# Patient Record
Sex: Male | Born: 1990 | Race: White | Hispanic: No | Marital: Single | Smoking: Current some day smoker
Health system: Southern US, Community
[De-identification: ages and names within clinical notes are randomized; demographics above are authoritative.]

## PROBLEM LIST (undated history)

## (undated) DIAGNOSIS — K219 Gastro-esophageal reflux disease without esophagitis: Secondary | ICD-10-CM

---
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
## 2011-12-17 DIAGNOSIS — S058X9A Other injuries of unspecified eye and orbit, initial encounter: Secondary | ICD-10-CM | POA: Insufficient documentation

## 2011-12-17 DIAGNOSIS — Y9367 Activity, basketball: Secondary | ICD-10-CM | POA: Insufficient documentation

## 2011-12-17 DIAGNOSIS — F17200 Nicotine dependence, unspecified, uncomplicated: Secondary | ICD-10-CM | POA: Insufficient documentation

## 2011-12-17 DIAGNOSIS — Y9239 Other specified sports and athletic area as the place of occurrence of the external cause: Secondary | ICD-10-CM | POA: Insufficient documentation

## 2011-12-17 DIAGNOSIS — W219XXA Striking against or struck by unspecified sports equipment, initial encounter: Secondary | ICD-10-CM | POA: Insufficient documentation

## 2011-12-17 DIAGNOSIS — Y92838 Other recreation area as the place of occurrence of the external cause: Secondary | ICD-10-CM | POA: Insufficient documentation

## 2011-12-18 ENCOUNTER — Encounter (HOSPITAL_COMMUNITY): Payer: Self-pay

## 2011-12-18 ENCOUNTER — Emergency Department (HOSPITAL_COMMUNITY)
Admission: EM | Admit: 2011-12-18 | Discharge: 2011-12-18 | Disposition: A | Discharge status: Home / Self Care | Payer: BC Managed Care – PPO | Attending: Emergency Medicine | Admitting: Emergency Medicine

## 2011-12-18 DIAGNOSIS — S0500XA Injury of conjunctiva and corneal abrasion without foreign body, unspecified eye, initial encounter: Secondary | ICD-10-CM

## 2011-12-18 DIAGNOSIS — H1130 Conjunctival hemorrhage, unspecified eye: Secondary | ICD-10-CM

## 2011-12-18 MED ORDER — FLUORESCEIN SODIUM 1 MG OP STRP
1.0000 | ORAL_STRIP | Freq: Once | OPHTHALMIC | Status: AC
  Administered 2011-12-18: 1 via OPHTHALMIC

## 2011-12-18 MED ORDER — TETRACAINE HCL 0.5 % OP SOLN
OPHTHALMIC | Status: AC
  Administered 2011-12-18: 01:00:00 via NASAL
  Filled 2011-12-18: qty 2

## 2011-12-18 MED ORDER — IBUPROFEN 800 MG PO TABS
800.0000 mg | ORAL_TABLET | Freq: Once | ORAL | Status: AC
  Administered 2011-12-18: 800 mg via ORAL

## 2011-12-18 MED ORDER — IBUPROFEN 800 MG PO TABS: 800.0000 mg | ORAL_TABLET | Freq: Three times a day (TID) | ORAL | Status: DC

## 2011-12-18 MED ORDER — ERYTHROMYCIN 5 MG/GM OP OINT
TOPICAL_OINTMENT | Freq: Four times a day (QID) | OPHTHALMIC | Status: DC
  Administered 2011-12-18: 01:00:00 via OPHTHALMIC
  Filled 2011-12-18: qty 3.5

## 2011-12-18 MED ORDER — IBUPROFEN 800 MG PO TABS
800.0000 mg | ORAL_TABLET | Freq: Once | ORAL | Status: DC
  Filled 2011-12-18: qty 1

## 2011-12-18 MED ORDER — ERYTHROMYCIN 5 MG/GM OP OINT: TOPICAL_OINTMENT | OPHTHALMIC | Status: DC

## 2011-12-18 NOTE — ED Notes (Signed)
Was playing basketball and we all went up for the ball and I was poked in my right eye. Having pain, blurred vision, and redness to left eye.

## 2011-12-18 NOTE — ED Provider Notes (Signed)
History     CSN: [id number]  Arrival date & time 12/17/11  2354   First MD Initiated Contact with Patient 12/18/11 0009      Chief Complaint  Patient presents with  . Eye Injury    (Consider location/radiation/quality/duration/timing/severity/associated sxs/prior treatment) HPI History provided by patient.plain basketball tonight and another player poked his right eye with his finger. He is able to continue playing basketball now presents with pain and some blurry vision. No bleeding or laceration. He does complain of eye redness. He does not wear contacts or have any previous eye problems. No LOC or neck pain. Some foreign body sensation. Quality described as gritty sensation to the eye. No radiation. No periorbital pain trauma or swelling. Moderate in severity. Pain constant since onset a few hours prior to arrival. No known aggravating or alleviating factors. History reviewed. No pertinent past medical history.  History reviewed. No pertinent past surgical history.  No family history on file.  History  Substance Use Topics  . Smoking status: Current Every Day Smoker    Types: Cigarettes  . Smokeless tobacco: Not on file  . Alcohol Use: No      Review of Systems  Constitutional: Negative for fever and chills.  HENT: Negative for facial swelling.   Eyes: Negative for pain.  Respiratory: Negative for shortness of breath.   Cardiovascular: Negative for chest pain.  Gastrointestinal: Negative for vomiting.  Genitourinary: Negative for flank pain.  Musculoskeletal: Negative for back pain.  Skin: Negative for rash.  Neurological: Negative for headaches.  All other systems reviewed and are negative.    Allergies  Review of patient's allergies indicates no known allergies.  Home Medications  No current outpatient prescriptions on file.  BP 119/70  Pulse 54  Temp 98.1 F (36.7 C) (Oral)  Resp 16  Ht 6' (1.829 m)  Wt 172 lb (78.019 kg)  BMI 23.33 kg/m2  SpO2  100%  Physical Exam  Constitutional: He is oriented to person, place, and time. He appears well-developed and well-nourished.  HENT:  Head: Normocephalic and atraumatic.  Mouth/Throat: Oropharynx is clear and moist.       Left eye appears normal. Right eye with subconjunctival hemorrhage. No hyphema. No periorbital swelling, tenderness or edema. Lids and lashes flipped and clear. No foreign body visualized.  Eyes: EOM are normal. Pupils are equal, round, and reactive to light.  Neck: Neck supple.  Cardiovascular: Regular rhythm and intact distal pulses.   Pulmonary/Chest: Effort normal. No respiratory distress.  Musculoskeletal: Normal range of motion. He exhibits no edema.  Neurological: He is alert and oriented to person, place, and time.  Skin: Skin is warm and dry.    ED Course  Procedures (including critical care time)  Procedure: Occular Exam 12:42 AM Consent obtained and timeout performed. Tetracaine and fluorescein applied to right eye. Woods lamp exam- no foreign body visualized. There is dye uptake to the cornea 6:00 to 9:00 position.  eye flushed with normal saline.Pain improved with tetracaine and patient tolerated procedure well.  Erythromycin ointment applied. Motrin provided.  Ophthalmology referral provided and plan recheck 48 hours  MDM   Eye injury with corneal abrasion and subconjunctival hemorrhage. Woods lamp exam. Visual acuity testing.signs and nursing notes reviewed and considered. medications provided.        [name], MD 12/18/11 [phone]

## 2016-10-01 ENCOUNTER — Encounter (HOSPITAL_COMMUNITY): Payer: Self-pay | Admitting: Emergency Medicine

## 2016-10-01 ENCOUNTER — Emergency Department (HOSPITAL_COMMUNITY)
Admission: EM | Admit: 2016-10-01 | Discharge: 2016-10-02 | Disposition: A | Discharge status: Home / Self Care | Payer: BLUE CROSS/BLUE SHIELD | Attending: Emergency Medicine | Admitting: Emergency Medicine

## 2016-10-01 DIAGNOSIS — R1110 Vomiting, unspecified: Secondary | ICD-10-CM | POA: Insufficient documentation

## 2016-10-01 DIAGNOSIS — R590 Localized enlarged lymph nodes: Secondary | ICD-10-CM | POA: Diagnosis not present

## 2016-10-01 DIAGNOSIS — M791 Myalgia: Secondary | ICD-10-CM | POA: Diagnosis not present

## 2016-10-01 DIAGNOSIS — J029 Acute pharyngitis, unspecified: Secondary | ICD-10-CM | POA: Diagnosis present

## 2016-10-01 DIAGNOSIS — J039 Acute tonsillitis, unspecified: Secondary | ICD-10-CM | POA: Insufficient documentation

## 2016-10-01 DIAGNOSIS — R531 Weakness: Secondary | ICD-10-CM | POA: Diagnosis not present

## 2016-10-01 DIAGNOSIS — F17210 Nicotine dependence, cigarettes, uncomplicated: Secondary | ICD-10-CM | POA: Diagnosis not present

## 2016-10-01 DIAGNOSIS — M2550 Pain in unspecified joint: Secondary | ICD-10-CM | POA: Insufficient documentation

## 2016-10-01 DIAGNOSIS — Z79899 Other long term (current) drug therapy: Secondary | ICD-10-CM | POA: Diagnosis not present

## 2016-10-01 DIAGNOSIS — R0989 Other specified symptoms and signs involving the circulatory and respiratory systems: Secondary | ICD-10-CM | POA: Insufficient documentation

## 2016-10-01 LAB — COMPREHENSIVE METABOLIC PANEL
ALBUMIN: 4.1 g/dL (ref 3.5–5.0)
ALT: 19 U/L (ref 17–63)
ANION GAP: 7 (ref 5–15)
AST: 28 U/L (ref 15–41)
Alkaline Phosphatase: 57 U/L (ref 38–126)
BILIRUBIN TOTAL: 1 mg/dL (ref 0.3–1.2)
BUN: 21 mg/dL — AB (ref 6–20)
CO2: 24 mmol/L (ref 22–32)
Calcium: 8.8 mg/dL — ABNORMAL LOW (ref 8.9–10.3)
Chloride: 104 mmol/L (ref 101–111)
Creatinine, Ser: 1.33 mg/dL — ABNORMAL HIGH (ref 0.61–1.24)
GFR calc Af Amer: >60 mL/min (ref >60)
GFR calc non Af Amer: >60 mL/min (ref >60)
GLUCOSE: 104 mg/dL — AB (ref 65–99)
Potassium: 3.9 mmol/L (ref 3.5–5.1)
SODIUM: 135 mmol/L (ref 135–145)
Total Protein: 6.8 g/dL (ref 6.5–8.1)

## 2016-10-01 LAB — RAPID STREP SCREEN (MED CTR MEBANE ONLY): Streptococcus, Group A Screen (Direct): NEGATIVE

## 2016-10-01 LAB — MONONUCLEOSIS SCREEN: Mono Screen: NEGATIVE

## 2016-10-01 MED ORDER — SODIUM CHLORIDE 0.9 % IV BOLUS (SEPSIS)
1000.0000 mL | Freq: Once | INTRAVENOUS | Status: AC
  Administered 2016-10-01: 1000 mL via INTRAVENOUS

## 2016-10-01 NOTE — ED Triage Notes (Signed)
Pt c/o headache, sore throat, fever, chills, vomiting, and weakness x 3 days. Pt was [facility] for questionable peritonsillar abscess.

## 2016-10-02 ENCOUNTER — Emergency Department (HOSPITAL_COMMUNITY): Payer: BLUE CROSS/BLUE SHIELD

## 2016-10-02 LAB — CBC WITH DIFFERENTIAL/PLATELET
BASOS ABS: 0 10*3/uL (ref 0.0–0.1)
Basophils Relative: 0 %
EOS ABS: 0 10*3/uL (ref 0.0–0.7)
Eosinophils Relative: 0 %
HEMATOCRIT: 40.6 % (ref 39.0–52.0)
Hemoglobin: 14.4 g/dL (ref 13.0–17.0)
LYMPHS ABS: 0.9 10*3/uL (ref 0.7–4.0)
LYMPHS PCT: 3 %
MCH: 34.1 pg — ABNORMAL HIGH (ref 26.0–34.0)
MCHC: 35.5 g/dL (ref 30.0–36.0)
MCV: 96.2 fL (ref 78.0–100.0)
MONO ABS: 2.7 10*3/uL — AB (ref 0.1–1.0)
Monocytes Relative: 10 %
NEUTROS ABS: 24.9 10*3/uL — AB (ref 1.7–7.7)
NEUTROS PCT: 87 %
PLATELETS: 239 10*3/uL (ref 150–400)
RBC: 4.22 MIL/uL (ref 4.22–5.81)
RDW: 12.6 % (ref 11.5–15.5)
WBC: 28.6 10*3/uL — AB (ref 4.0–10.5)

## 2016-10-02 MED ORDER — IOPAMIDOL (ISOVUE-300) INJECTION 61%
75.0000 mL | Freq: Once | INTRAVENOUS | Status: AC | PRN
  Administered 2016-10-02: 75 mL via INTRAVENOUS

## 2016-10-02 MED ORDER — HYDROCODONE-ACETAMINOPHEN 5-325 MG PO TABS: 1.0000 | ORAL_TABLET | ORAL | 0 refills | Status: DC | PRN

## 2016-10-02 MED ORDER — HYDROCODONE-ACETAMINOPHEN 5-325 MG PO TABS
1.0000 | ORAL_TABLET | Freq: Once | ORAL | Status: AC
  Administered 2016-10-02: 1 via ORAL

## 2016-10-02 MED ORDER — CLINDAMYCIN PHOSPHATE 600 MG/50ML IV SOLN
600.0000 mg | Freq: Once | INTRAVENOUS | Status: AC
  Administered 2016-10-02: 600 mg via INTRAVENOUS
  Filled 2016-10-02: qty 50

## 2016-10-02 MED ORDER — SODIUM CHLORIDE 0.9 % IV BOLUS (SEPSIS): 1000.0000 mL | Freq: Once | INTRAVENOUS | Status: DC

## 2016-10-02 MED ORDER — CLINDAMYCIN HCL 300 MG PO CAPS: 300.0000 mg | ORAL_CAPSULE | Freq: Three times a day (TID) | ORAL | 0 refills | Status: DC

## 2016-10-02 MED ORDER — HYDROCODONE-ACETAMINOPHEN 5-325 MG PO TABS
ORAL_TABLET | ORAL | Status: AC
  Filled 2016-10-02: qty 1

## 2016-10-02 MED ORDER — DEXAMETHASONE SODIUM PHOSPHATE 4 MG/ML IJ SOLN
10.0000 mg | Freq: Once | INTRAMUSCULAR | Status: AC
  Administered 2016-10-02: 10 mg via INTRAVENOUS
  Filled 2016-10-02: qty 3

## 2016-10-02 NOTE — Discharge Instructions (Signed)
Take the antibiotics as prescribed. Follow up with Dr. Pollyann Kennedyosen.  There is no abscess now, but there might be one in the future. Return to the ED if you develop difficulty breathing, difficulty swallowing, or any other concerns.

## 2016-10-02 NOTE — ED Notes (Signed)
Pt tolerated po challenge well.  

## 2016-10-02 NOTE — ED Provider Notes (Signed)
[facility]-EMER[facility]ENCY DEPT Provider Note   CSN: [id number] Arrival date & time: 10/01/16  2155     History   Chief Complaint Chief Complaint  Patient presents with  . Sore Throat    HPI Jonathan Nash is a 26 y.o. male.  Patient sent from urgent care with concern for peritonsillar abscess. States he's had a sore throat with fever since last night. He has had dry heaves and one episode of vomiting today with generalized fatigue and weakness. Complains of pain to the left side of his throat and pain with swallowing. No cough. No runny nose. No diarrhea. No chest pain or shortness of breath. Denies any sick contacts.   The history is provided by the patient.  Sore Throat  Pertinent negatives include no chest pain, no abdominal pain, no headaches and no shortness of breath.    History reviewed. No pertinent past medical history.  There are no active problems to display for this patient.   History reviewed. No pertinent surgical history.     Home Medications    Prior to Admission medications   Medication Sig Start Date End Date Taking? Authorizing Provider  erythromycin ophthalmic ointment Place a 1/2 inch ribbon of ointment into the lower eyelid. 12/18/11   [name], MD  ibuprofen (ADVIL,MOTRIN) 800 MG tablet Take 1 tablet (800 mg total) by mouth 3 (three) times daily. 12/18/11   [name], MD    Family History No family history on file.  Social History Social History  Substance Use Topics  . Smoking status: Current Every Day Smoker    Types: Cigarettes  . Smokeless tobacco: Never Used  . Alcohol use No     Allergies   Patient has no known allergies.   Review of Systems Review of Systems  Constitutional: Positive for activity change, appetite change and fever.  HENT: Positive for congestion, sore throat and trouble swallowing. Negative for nosebleeds, postnasal drip and voice change.   Eyes: Negative for visual disturbance.  Respiratory: Negative for  cough and shortness of breath.   Cardiovascular: Negative for chest pain.  Gastrointestinal: Negative for abdominal pain, nausea and vomiting.  Genitourinary: Negative for dysuria and hematuria.  Musculoskeletal: Positive for arthralgias and myalgias.  Skin: Negative for rash and wound.  Neurological: Positive for weakness. Negative for dizziness and headaches.   all other systems are negative except as noted in the HPI and PMH.     Physical Exam Updated Vital Signs BP 110/65   Pulse 73   Temp 100.2 F (37.9 C) (Oral)   Resp 16   Ht 6' (1.829 m)   Wt 75.8 kg (167 lb)   SpO2 98%   BMI 22.65 kg/m   Physical Exam  Constitutional: He is oriented to person, place, and time. He appears well-developed and well-nourished. No distress.  Ill appearing, nontoxic  HENT:  Head: Normocephalic and atraumatic.  Mouth/Throat: Oropharyngeal exudate present.  Erythematous tonsils left greater than right with possible soft palate bulge in the left. Uvula is midline. Exudate present. No stridor. No trismus. Normal voice  Eyes: Pupils are equal, round, and reactive to light. Conjunctivae and EOM are normal.  Neck: Normal range of motion. Neck supple.  No meningismus.  Cardiovascular: Normal rate, regular rhythm, normal heart sounds and intact distal pulses.   No murmur heard. Pulmonary/Chest: Effort normal and breath sounds normal. No respiratory distress.  Abdominal: Soft. There is no tenderness. There is no rebound and no guarding.  Musculoskeletal: Normal range of motion. He exhibits  no edema or tenderness.  Lymphadenopathy:    He has cervical adenopathy.  Neurological: He is alert and oriented to person, place, and time. No cranial nerve deficit. He exhibits normal muscle tone. Coordination normal.   5/5 strength throughout. CN 2-12 intact.Equal grip strength.   Skin: Skin is warm.  Psychiatric: He has a normal mood and affect. His behavior is normal.  Nursing note and vitals  reviewed.    ED Treatments / Results  Labs (all labs ordered are listed, but only abnormal results are displayed) Labs Reviewed  CBC WITH DIFFERENTIAL/PLATELET - Abnormal; Notable for the following:       Result Value   WBC 28.6 (*)    MCH 34.1 (*)    Neutro Abs 24.9 (*)    Monocytes Absolute 2.7 (*)    All other components within normal limits  COMPREHENSIVE METABOLIC PANEL - Abnormal; Notable for the following:    Glucose, Bld 104 (*)    BUN 21 (*)    Creatinine, Ser 1.33 (*)    Calcium 8.8 (*)    All other components within normal limits  RAPID STREP SCREEN (NOT AT [facility])  CULTURE, GROUP A STREP [facility])  MONONUCLEOSIS SCREEN    EKG  EKG Interpretation None       Radiology Ct Soft Tissue Neck W Contrast  Result Date: 10/02/2016 CLINICAL DATA:  26 y/o M; headache, sore throat, fevers, chills, vomiting, and weakness. Question peritonsillar abscess. EXAM: CT NECK WITH CONTRAST TECHNIQUE: Multidetector CT imaging of the neck was performed using the standard protocol following the bolus administration of intravenous contrast. CONTRAST:  75mL ISOVUE-300 IOPAMIDOL (ISOVUE-300) INJECTION 61% COMPARISON:  None. FINDINGS: Pharynx and larynx: The severe enlargement of the left palatine tonsil with several subcentimeter foci of hypodensity without a clear rim enhancing abscess likely representing phlegmonous changes. There is edema extending into the left soft palate and left lateral wall of the oral and hypopharynx compatible with acute pharyngitis. There is mild edema within the left parapharyngeal fat. No prevertebral/ retropharyngeal abscess identified. Salivary glands: No inflammation, mass, or stone. Thyroid: Normal. Lymph nodes: Left cervical adenopathy is likely reactive. Vascular: Negative. Limited intracranial: Negative. Visualized orbits: Negative. Mastoids and visualized paranasal sinuses: Clear. Skeleton: No acute or aggressive process. Upper chest: Negative. Other: None.  IMPRESSION: 1. Severe enlargement of left palatine tonsil. Several small lucencies in the tonsil are compatible with phlegmonous changes. No discrete rim enhancing abscess identified. 2. Edema in the left soft palate and left lateral wall of the oropharynx as well as hypopharynx compatible with acute pharyngitis. 3. Left cervical lymphadenopathy is likely reactive. Electronically Signed   By: Mitzi HansenLance  Furusawa-Stratton M.D.   On: 10/02/2016 01:23    Procedures Procedures (including critical care time)  Medications Ordered in ED Medications  sodium chloride 0.9 % bolus 1,000 mL (1,000 mLs Intravenous New Bag/Given 10/01/16 2311)  dexamethasone (DECADRON) injection 10 mg (not administered)  clindamycin (CLEOCIN) IVPB 600 mg (not administered)     Initial Impression / Assessment and Plan / ED Course  I have reviewed the triage vital signs and the nursing notes.  Pertinent labs & imaging results that were available during my care of the patient were reviewed by me and considered in my medical decision making (see chart for details).    Sore throat and fever concern for possible peritonsillar abscess.  Leukocytosis noted. Patient has normal blood pressure and heart rate. Patient given IV fluids and IV antibiotics. Blood pressure and heart rate are normal despite leukocytosis  and fever. Does not meet sepsis criteria.  IV clindamycin, IV Decadron given.  CT scan shows tonsillitis without drainable abscess. Discussed with Dr. Pollyann Kennedy of ENT who agrees with antibiotics and outpatient follow-up.  Patient will be treated for tonsillitis with phlegmon and possible early abscess. No drainable abscess at this time. Patient is tolerating by mouth.  Discussed antibiotics, fluids, ENT follow-up. Return precautions discussed including difficulty breathing, difficulty swallowing, or any other concerns.  Final Clinical Impressions(s) / ED Diagnoses   Final diagnoses:  Tonsillitis    New  Prescriptions Discharge Medication List as of 10/02/2016  3:08 AM    START taking these medications   Details  clindamycin (CLEOCIN) 300 MG capsule Take 1 capsule (300 mg total) by mouth 3 (three) times daily., Starting Fri 10/02/2016, Print    HYDROcodone-acetaminophen (NORCO/VICODIN) 5-325 MG tablet Take 1 tablet by mouth every 4 (four) hours as needed., Starting Fri 10/02/2016, Print         Miles Leyda, [name], MD 10/02/16 [phone]

## 2016-10-04 LAB — CULTURE, GROUP A STREP (THRC)

## 2016-10-07 LAB — CULTURE, BLOOD (ROUTINE X 2)
CULTURE: NO GROWTH
Culture: NO GROWTH
SPECIAL REQUESTS: ADEQUATE
SPECIAL REQUESTS: ADEQUATE

## 2017-11-26 ENCOUNTER — Emergency Department (HOSPITAL_COMMUNITY)
Admission: EM | Admit: 2017-11-26 | Discharge: 2017-11-27 | Disposition: A | Discharge status: Home / Self Care | Payer: Managed Care, Other (non HMO) | Attending: Emergency Medicine | Admitting: Emergency Medicine

## 2017-11-26 ENCOUNTER — Encounter (HOSPITAL_COMMUNITY): Payer: Self-pay | Admitting: Emergency Medicine

## 2017-11-26 ENCOUNTER — Other Ambulatory Visit: Payer: Self-pay

## 2017-11-26 DIAGNOSIS — R1013 Epigastric pain: Secondary | ICD-10-CM | POA: Insufficient documentation

## 2017-11-26 DIAGNOSIS — F17210 Nicotine dependence, cigarettes, uncomplicated: Secondary | ICD-10-CM | POA: Diagnosis not present

## 2017-11-26 LAB — COMPREHENSIVE METABOLIC PANEL
ALT: 14 U/L (ref 0–44)
AST: 24 U/L (ref 15–41)
Albumin: 4.1 g/dL (ref 3.5–5.0)
Alkaline Phosphatase: 78 U/L (ref 38–126)
Anion gap: 8 (ref 5–15)
BILIRUBIN TOTAL: 0.5 mg/dL (ref 0.3–1.2)
BUN: 19 mg/dL (ref 6–20)
CALCIUM: 8.8 mg/dL — AB (ref 8.9–10.3)
CO2: 24 mmol/L (ref 22–32)
CREATININE: 1.24 mg/dL (ref 0.61–1.24)
Chloride: 105 mmol/L (ref 98–111)
GFR calc non Af Amer: >60 mL/min (ref >60)
Glucose, Bld: 87 mg/dL (ref 70–99)
Potassium: 4.4 mmol/L (ref 3.5–5.1)
Sodium: 137 mmol/L (ref 135–145)
TOTAL PROTEIN: 6.9 g/dL (ref 6.5–8.1)

## 2017-11-26 LAB — CBC WITH DIFFERENTIAL/PLATELET
Abs Immature Granulocytes: 0.02 10*3/uL (ref 0.00–0.07)
BASOS PCT: 1 %
Basophils Absolute: 0.1 10*3/uL (ref 0.0–0.1)
EOS ABS: 0.1 10*3/uL (ref 0.0–0.5)
EOS PCT: 1 %
HCT: 43.5 % (ref 39.0–52.0)
Hemoglobin: 14.6 g/dL (ref 13.0–17.0)
Immature Granulocytes: 0 %
Lymphocytes Relative: 36 %
Lymphs Abs: 2.5 10*3/uL (ref 0.7–4.0)
MCH: 33.4 pg (ref 26.0–34.0)
MCHC: 33.6 g/dL (ref 30.0–36.0)
MCV: 99.5 fL (ref 80.0–100.0)
MONO ABS: 0.7 10*3/uL (ref 0.1–1.0)
Monocytes Relative: 11 %
NRBC: 0 % (ref 0.0–0.2)
Neutro Abs: 3.5 10*3/uL (ref 1.7–7.7)
Neutrophils Relative %: 51 %
PLATELETS: 252 10*3/uL (ref 150–400)
RBC: 4.37 MIL/uL (ref 4.22–5.81)
RDW: 12.7 % (ref 11.5–15.5)
WBC: 6.9 10*3/uL (ref 4.0–10.5)

## 2017-11-26 LAB — LIPASE, BLOOD: Lipase: 27 U/L (ref 11–51)

## 2017-11-26 MED ORDER — DICYCLOMINE HCL 20 MG PO TABS: 20.0000 mg | ORAL_TABLET | Freq: Two times a day (BID) | ORAL | 0 refills | Status: DC | PRN

## 2017-11-26 MED ORDER — DICYCLOMINE HCL 10 MG PO CAPS
10.0000 mg | ORAL_CAPSULE | Freq: Once | ORAL | Status: AC
  Administered 2017-11-26: 10 mg via ORAL
  Filled 2017-11-26: qty 1

## 2017-11-26 MED ORDER — PANTOPRAZOLE SODIUM 40 MG PO TBEC: 40.0000 mg | DELAYED_RELEASE_TABLET | Freq: Every day | ORAL | 0 refills | Status: DC

## 2017-11-26 MED ORDER — ALUM & MAG HYDROXIDE-SIMETH 200-200-20 MG/5ML PO SUSP
30.0000 mL | Freq: Once | ORAL | Status: AC
Start: 2017-11-26 — End: 2017-11-26
  Administered 2017-11-26: 30 mL via ORAL
  Filled 2017-11-26: qty 30

## 2017-11-26 MED ORDER — SUCRALFATE 1 G PO TABS: 1.0000 g | ORAL_TABLET | Freq: Three times a day (TID) | ORAL | 0 refills | Status: DC | PRN

## 2017-11-26 NOTE — ED Notes (Signed)
Pt reports that he has upper bilateral quad abdominal pain, pt reports that this episode started today. Pt does reports that this is a chronic issue and at times is affected by what he eats. Pt reports pizza and other food cause his discomfort. Pt reports that he ate pizza today.

## 2017-11-26 NOTE — Discharge Instructions (Signed)

## 2017-11-26 NOTE — ED Provider Notes (Signed)
Emergency Department Provider Note   I have reviewed the triage vital signs and the nursing notes.   HISTORY  Chief Complaint Abdominal Pain   HPI Jonathan Nash is a 27 y.o. male with PMH of GERD presents to the emergency department for evaluation of epigastric abdominal pain.  Patient states he has had intermittent pain over the past 2 years.  Symptoms are worse with eating certain foods.  This evening, the patient ate some pizza and had return of the epigastric, cramping pain.  He denies any fevers or chills.  He does have some mild nausea but no vomiting.  No diarrhea.  He denies any chest pain or shortness of breath. Patient had been taking Pantoprazole and Sucralfate but ran out of Rx two weeks prior.   History reviewed. No pertinent past medical history.  There are no active problems to display for this patient.   History reviewed. No pertinent surgical history.  Allergies Patient has no known allergies.  No family history on file.  Social History Social History   Tobacco Use  . Smoking status: Current Every Day Smoker    Types: Cigarettes  . Smokeless tobacco: Never Used  Substance Use Topics  . Alcohol use: No  . Drug use: No    Review of Systems  Constitutional: No fever/chills Eyes: No visual changes. ENT: No sore throat. Cardiovascular: Denies chest pain. Respiratory: Denies shortness of breath. Gastrointestinal: Positive epigastric abdominal pain.  No nausea, no vomiting.  No diarrhea.  No constipation. Genitourinary: Negative for dysuria. Musculoskeletal: Negative for back pain. Skin: Negative for rash. Neurological: Negative for headaches, focal weakness or numbness.  10-point ROS otherwise negative.  ____________________________________________   PHYSICAL EXAM:  VITAL SIGNS: ED Triage Vitals  Enc Vitals Group     BP 11/26/17 2146 127/83     Pulse Rate 11/26/17 2146 69     Resp 11/26/17 2146 16     Temp 11/26/17 2146 98 F (36.7  C)     Temp Source 11/26/17 2146 Oral     SpO2 11/26/17 2146 99 %     Weight 11/26/17 2146 168 lb (76.2 kg)     Height 11/26/17 2146 6' (1.829 m)     Pain Score 11/26/17 2214 7   Constitutional: Alert and oriented. Well appearing and in no acute distress. Eyes: Conjunctivae are normal.  Head: Atraumatic. Nose: No congestion/rhinnorhea. Mouth/Throat: Mucous membranes are moist.  Oropharynx non-erythematous. Neck: No stridor.  Cardiovascular: Normal rate, regular rhythm. Good peripheral circulation. Grossly normal heart sounds.   Respiratory: Normal respiratory effort.  No retractions. Lungs CTAB. Gastrointestinal: Soft with mild epigastric tenderness. No distention.  Musculoskeletal: No lower extremity tenderness nor edema. No gross deformities of extremities. Neurologic:  Normal speech and language. No gross focal neurologic deficits are appreciated.  Skin:  Skin is warm, dry and intact. No rash noted.  ____________________________________________   LABS (all labs ordered are listed, but only abnormal results are displayed)  Labs Reviewed  COMPREHENSIVE METABOLIC PANEL - Abnormal; Notable for the following components:      Result Value   Calcium 8.8 (*)    All other components within normal limits  LIPASE, BLOOD  CBC WITH DIFFERENTIAL/PLATELET   ____________________________________________  RADIOLOGY  None ____________________________________________   PROCEDURES  Procedure(s) performed:   Procedures  None  ____________________________________________   INITIAL IMPRESSION / ASSESSMENT AND PLAN / ED COURSE  Pertinent labs & imaging results that were available during my care of the patient were reviewed by me and  considered in my medical decision making (see chart for details).  Patient presents to the emergency department for evaluation of epigastric abdominal pain.  He has had similar, intermittent pain over the past 2 years.  No concerning findings on  abdominal exam other than mild epigastric tenderness.  Plan for screening labs but low suspicion for gallbladder disease.  Will give Maalox and Bentyl.   Labs are negative for acute abnormality. Plan for outpatient PCP and GI follow up. Numbers provided. Will restart Protonix, Carafate, and Bentyl. PRN.   At this time, I do not feel there is any life-threatening condition present. I have reviewed and discussed all results (EKG, imaging, lab, urine as appropriate), exam findings with patient. I have reviewed nursing notes and appropriate previous records.  I feel the patient is safe to be discharged home without further emergent workup. Discussed usual and customary return precautions. Patient and family (if present) verbalize understanding and are comfortable with this plan.  Patient will follow-up with their primary care provider. If they do not have a primary care provider, information for follow-up has been provided to them. All questions have been answered.  ____________________________________________  FINAL CLINICAL IMPRESSION(S) / ED DIAGNOSES  Final diagnoses:  Epigastric pain     MEDICATIONS GIVEN DURING THIS VISIT:  Medications  alum & mag hydroxide-simeth (MAALOX/MYLANTA) 200-200-20 MG/5ML suspension 30 mL (30 mLs Oral Given 11/26/17 2252)  dicyclomine (BENTYL) capsule 10 mg (10 mg Oral Given 11/26/17 2252)     NEW OUTPATIENT MEDICATIONS STARTED DURING THIS VISIT:  Discharge Medication List as of 11/26/2017 11:50 PM    START taking these medications   Details  dicyclomine (BENTYL) 20 MG tablet Take 1 tablet (20 mg total) by mouth 2 (two) times daily as needed for spasms., Starting Fri 11/26/2017, Print    sucralfate (CARAFATE) 1 g tablet Take 1 tablet (1 g total) by mouth 3 (three) times daily as needed., Starting Fri 11/26/2017, Print        Note:  This document was prepared using Dragon voice recognition software and may include unintentional dictation errors.  Alona Bene, MD Emergency Medicine    Shirley Decamp, [name], MD 11/27/17 [phone]

## 2017-11-26 NOTE — ED Triage Notes (Signed)
Patient c/o upper abdominal pain x2 years. Hx GERD. Reports he is out of sucralfate and pantoprazole and requesting refill. Denies N/V/D.

## 2017-11-27 NOTE — ED Notes (Signed)
PT DISCHARGED. INSTRUCTIONS AND PRESCRIPTIONS GIVEN. AAOX4. PT IN NO APPARENT DISTRESS WITH MILD PAIN. THE OPPORTUNITY TO ASK QUESTIONS WAS PROVIDED. 

## 2017-12-21 ENCOUNTER — Emergency Department (HOSPITAL_COMMUNITY)
Admission: EM | Admit: 2017-12-21 | Discharge: 2017-12-21 | Disposition: A | Discharge status: Home / Self Care | Payer: Managed Care, Other (non HMO) | Attending: Emergency Medicine | Admitting: Emergency Medicine

## 2017-12-21 ENCOUNTER — Encounter (HOSPITAL_COMMUNITY): Payer: Self-pay

## 2017-12-21 ENCOUNTER — Other Ambulatory Visit: Payer: Self-pay

## 2017-12-21 DIAGNOSIS — M25562 Pain in left knee: Secondary | ICD-10-CM | POA: Diagnosis not present

## 2017-12-21 DIAGNOSIS — Y92017 Garden or yard in single-family (private) house as the place of occurrence of the external cause: Secondary | ICD-10-CM | POA: Diagnosis not present

## 2017-12-21 DIAGNOSIS — Y93H2 Activity, gardening and landscaping: Secondary | ICD-10-CM | POA: Insufficient documentation

## 2017-12-21 DIAGNOSIS — F17210 Nicotine dependence, cigarettes, uncomplicated: Secondary | ICD-10-CM | POA: Insufficient documentation

## 2017-12-21 DIAGNOSIS — Y999 Unspecified external cause status: Secondary | ICD-10-CM | POA: Diagnosis not present

## 2017-12-21 DIAGNOSIS — S8992XA Unspecified injury of left lower leg, initial encounter: Secondary | ICD-10-CM | POA: Diagnosis present

## 2017-12-21 DIAGNOSIS — X501XXA Overexertion from prolonged static or awkward postures, initial encounter: Secondary | ICD-10-CM | POA: Diagnosis not present

## 2017-12-21 MED ORDER — ACETAMINOPHEN 500 MG PO TABS: 500.0000 mg | ORAL_TABLET | Freq: Four times a day (QID) | ORAL | 0 refills | Status: DC | PRN

## 2017-12-21 MED ORDER — IBUPROFEN 600 MG PO TABS: 600.0000 mg | ORAL_TABLET | Freq: Four times a day (QID) | ORAL | 0 refills | Status: DC | PRN

## 2017-12-21 NOTE — ED Provider Notes (Signed)
[facility]-EMERGENCY DEPT Provider Note   CSN: [id number] Arrival date & time: 12/21/17  2036     History   Chief Complaint Chief Complaint  Patient presents with  . Knee Pain    Left    HPI Jonathan Nash is a 27 y.o. male presents for evaluation of acute onset, constant left knee pain secondary to yardwork yesterday.  Patient reports that he was doing yard work and feels as though he may have planted his foot wrong with abnormal positioning of the left knee.  He reports he heard a "pop "and then developed constant dull throbbing pain to the medial aspect of the knee.  Pain worsens and becomes sharp with flexion of the knee.  Pain does not radiate.  He denies fevers, numbness, tingling, or weakness.  He is able to bear weight and ambulate without difficulty.  He reports that he has a similar history of ACL/MCL injury while in high school and his left knee pain will occasionally flareup as a result.  He reports it is usually worse at work where he is on his feet all day.  He has been applying ice with some improvement.  The history is provided by the patient.    History reviewed. No pertinent past medical history.  There are no active problems to display for this patient.   History reviewed. No pertinent surgical history.      Home Medications    Prior to Admission medications   Medication Sig Start Date End Date Taking? Authorizing Provider  acetaminophen (TYLENOL) 500 MG tablet Take 1 tablet (500 mg total) by mouth every 6 (six) hours as needed. 12/21/17   Luevenia MaxinFawze, Tauheed Mcfayden A, PA-C  clindamycin (CLEOCIN) 300 MG capsule Take 1 capsule (300 mg total) by mouth 3 (three) times daily. Patient not taking: Reported on 11/26/2017 10/02/16   Glynn Octaveancour, Stephen, MD  dicyclomine (BENTYL) 20 MG tablet Take 1 tablet (20 mg total) by mouth 2 (two) times daily as needed for spasms. 11/26/17   Long, Arlyss RepressJoshua G, MD  erythromycin ophthalmic ointment Place a 1/2 inch ribbon of  ointment into the lower eyelid. Patient not taking: Reported on 11/26/2017 12/18/11   Sunnie Nielsenpitz, Brian, MD  HYDROcodone-acetaminophen (NORCO/VICODIN) 5-325 MG tablet Take 1 tablet by mouth every 4 (four) hours as needed. Patient not taking: Reported on 11/26/2017 10/02/16   Glynn Octaveancour, Stephen, MD  ibuprofen (ADVIL,MOTRIN) 600 MG tablet Take 1 tablet (600 mg total) by mouth every 6 (six) hours as needed. 12/21/17   Julus Kelley A, PA-C  pantoprazole (PROTONIX) 40 MG tablet Take 1 tablet (40 mg total) by mouth daily. 11/26/17 12/26/17  Long, Arlyss RepressJoshua G, MD  sucralfate (CARAFATE) 1 g tablet Take 1 tablet (1 g total) by mouth 3 (three) times daily as needed. 11/26/17   Long, Arlyss RepressJoshua G, MD    Family History No family history on file.  Social History Social History   Tobacco Use  . Smoking status: Current Every Day Smoker    Packs/day: 0.50    Types: Cigarettes  . Smokeless tobacco: Never Used  Substance Use Topics  . Alcohol use: No  . Drug use: No     Allergies   Patient has no known allergies.   Review of Systems Review of Systems  Constitutional: Negative for chills and fever.  Musculoskeletal: Positive for arthralgias.  Neurological: Negative for weakness and numbness.     Physical Exam Updated Vital Signs BP 118/85 (BP Location: Right Arm)   Pulse 61  Temp 98.3 F (36.8 C) (Oral)   Resp 16   Ht 6' (1.829 m)   Wt 74.4 kg   SpO2 100%   BMI 22.24 kg/m   Physical Exam  Constitutional: He appears well-developed and well-nourished. No distress.  HENT:  Head: Normocephalic and atraumatic.  Eyes: Conjunctivae are normal. Right eye exhibits no discharge. Left eye exhibits no discharge.  Neck: No JVD present. No tracheal deviation present.  Cardiovascular: Normal rate and intact distal pulses.  2+ DP/PT pulses bilaterally, no lower extremity edema, Homans sign absent bilaterally, no palpable cords, compartments are soft  Pulmonary/Chest: Effort normal.  Abdominal: He exhibits no  distension.  Musculoskeletal: Normal range of motion. He exhibits tenderness. He exhibits no edema.       Left knee: Tenderness found. Medial joint line and MCL tenderness noted.  Mild valgus instability noted on examination of the left knee.  Negative anterior/posterior drawer test.  Patient able to extend left lower extremity against gravity without difficulty.  No quadriceps tendon deformity.  5/5 strength of BLE major muscle groups.  Neurological: He is alert. No sensory deficit.  Fluent speech, no facial droop, sensation intact to soft touch of bilateral lower extremities, normal gait, and patient able to heel walk and toe walk without difficulty.   Skin: Skin is warm and dry. No erythema.  Psychiatric: He has a normal mood and affect. His behavior is normal.  Nursing note and vitals reviewed.    ED Treatments / Results  Labs (all labs ordered are listed, but only abnormal results are displayed) Labs Reviewed - No data to display  EKG None  Radiology No results found.  Procedures Procedures (including critical care time)  Medications Ordered in ED Medications - No data to display   Initial Impression / Assessment and Plan / ED Course  I have reviewed the triage vital signs and the nursing notes.  Pertinent labs & imaging results that were available during my care of the patient were reviewed by me and considered in my medical decision making (see chart for details).    Patient with prior history of ligament injury to the left knee presenting with pain to the medial aspect of the left knee after twisting his knee while doing yard work yesterday.  He is afebrile, vital signs are stable.  He is nontoxic in appearance.  He is neurovascularly intact.  He is weightbearing.  No evidence of DVT, septic joint, gout, or osteomyelitis.  I offered the patient radiographs which he declined.  He states he will return for x-rays if his symptoms do not improve.  I think this is reasonable  given he is weightbearing and there is likely no significant acute osseous abnormality.  He does understand that there is a risk of missed fracture.  Physical examination suggestive of MCL tear/sprain.  Conservative therapy indicated and discussed with patient.  Will discharge with crutches, knee immobilizer.  Discussed ice, compression, elevation, NSAIDs, and Tylenol.  Recommend follow-up with orthopedics for reevaluation of symptoms.  Discussed strict ED return precautions. Pt verbalized understanding of and agreement with plan and is safe for discharge home at this time.   Final Clinical Impressions(s) / ED Diagnoses   Final diagnoses:  Acute pain of left knee    ED Discharge Orders         Ordered    ibuprofen (ADVIL,MOTRIN) 600 MG tablet  Every 6 hours PRN     12/21/17 2109    acetaminophen (TYLENOL) 500 MG tablet  Every  6 hours PRN     12/21/17 2109           Bennye Alm 12/21/17 2111    [name], MD 12/25/17 [phone]

## 2017-12-21 NOTE — Discharge Instructions (Addendum)
1. Medications: Alternate 600 mg of ibuprofen and [phone] mg of Tylenol every 3 hours as needed for pain. Do not exceed 4000 mg of Tylenol daily.  Take ibuprofen with food to avoid upset stomach issues.  2. Treatment: rest, ice, elevate and use brace and crutches, drink plenty of fluids, gentle stretching 3. Follow Up: Please followup with orthopedics as directed or your PCP in 1 week if no improvement for discussion of your diagnoses and further evaluation after today's visit; if you do not have a primary care doctor use the resource guide provided to find one; Please return to the ER for worsening symptoms or other concerns such as worsening swelling, redness of the skin, fevers, loss of pulses, or loss of feeling

## 2017-12-21 NOTE — ED Triage Notes (Addendum)
Pt comes in by POV due to left knee pain. Pt has a hx of ACL/MCL injuries. Pt was doing yard work yesterday and "heard it pop." Pt has been having pain in the left knee since then. Pt c/o pain 7/10. Pt is ambulatory.

## 2018-01-05 ENCOUNTER — Ambulatory Visit (INDEPENDENT_AMBULATORY_CARE_PROVIDER_SITE_OTHER): Payer: Managed Care, Other (non HMO) | Admitting: Orthopaedic Surgery

## 2018-01-07 ENCOUNTER — Ambulatory Visit (INDEPENDENT_AMBULATORY_CARE_PROVIDER_SITE_OTHER): Payer: Managed Care, Other (non HMO) | Admitting: Orthopaedic Surgery

## 2018-01-07 ENCOUNTER — Ambulatory Visit (INDEPENDENT_AMBULATORY_CARE_PROVIDER_SITE_OTHER): Payer: Managed Care, Other (non HMO)

## 2018-01-07 ENCOUNTER — Encounter (INDEPENDENT_AMBULATORY_CARE_PROVIDER_SITE_OTHER): Payer: Self-pay | Admitting: Orthopaedic Surgery

## 2018-01-07 DIAGNOSIS — G8929 Other chronic pain: Secondary | ICD-10-CM

## 2018-01-07 DIAGNOSIS — M25562 Pain in left knee: Secondary | ICD-10-CM

## 2018-01-07 MED ORDER — LIDOCAINE HCL 1 % IJ SOLN
2.0000 mL | INTRAMUSCULAR | Status: AC | PRN
  Administered 2018-01-07: 2 mL

## 2018-01-07 MED ORDER — METHYLPREDNISOLONE ACETATE 40 MG/ML IJ SUSP
40.0000 mg | INTRAMUSCULAR | Status: AC | PRN
  Administered 2018-01-07: 40 mg via INTRA_ARTICULAR

## 2018-01-07 MED ORDER — BUPIVACAINE HCL 0.25 % IJ SOLN
2.0000 mL | INTRAMUSCULAR | Status: AC | PRN
  Administered 2018-01-07: 2 mL via INTRA_ARTICULAR

## 2018-01-07 NOTE — Progress Notes (Signed)
Office Visit Note   Patient: Jonathan Nash           Date of Birth: 16-Feb-1990           MRN: [id number] Visit Date: 01/07/2018              Requested by: Jeni SallesPatterson, Kathy L, FNP [address] [place], [place] [id number] PCP: Jeni SallesPatterson, Kathy L, FNP   Assessment & Plan: Visit Diagnoses:  1. Chronic pain of left knee     Plan: Impression is left knee medial meniscus tear.  The patient is still able to ambulate and work despite the pain, so we will first proceed with an intra-articular cortisone injection.  If he does not note significant improvement 2 weeks from now, he will call us and we will get an MRI to further assess his meniscus.  Follow-up with us as needed otherwise.  Follow-Up Instructions: Return if symptoms worsen or fail to improve.   Orders:  Orders Placed This Encounter  Procedures  . Large Joint Inj: L knee  . XR KNEE 3 VIEW LEFT   No orders of the defined types were placed in this encounter.     Procedures: Large Joint Inj: L knee on 01/07/2018 8:32 AM Indications: pain Details: 22 G needle, anterolateral approach Medications: 2 mL lidocaine 1 %; 2 mL bupivacaine 0.25 %; 40 mg methylPREDNISolone acetate 40 MG/ML      Clinical Data: No additional findings.   Subjective: Chief Complaint  Patient presents with  . Left Knee - Pain    HPI patient is a pleasant 27 year old gentleman who presents to our clinic today with left knee pain.  This began approximately 2 weeks ago when he was doing yard work and stepped down the wrong way.  Since then he has had marked pain medial aspect.  He does note persistent mild swelling with occasional locking.  Pain is worse going up and down.  He has been taking ibuprofen with mild relief of symptoms.  He does note a previous injury playing football in high school for which she had to sit out for an entire season.  He also notes an injury where he sprained his MCL while playing basketball 4 to 5 years  ago.  He wore a brace and this seemed to improve but still had slight persistent pain.  Review of Systems as detailed in HPI.  All others reviewed and are negative.   Objective: Vital Signs: There were no vitals taken for this visit.  Physical Exam well-developed and well-nourished gentleman in no acute distress.  Alert and oriented x3.  Ortho Exam examination left knee reveals no effusion.  Range of motion 0 to 125 degrees.  He does have moderate tenderness medial meniscus with a positive medial McMurray.  He is stable to valgus and varus stress both sides.  No patellofemoral crepitus.  He is neurovascularly intact distally.  Specialty Comments:  No specialty comments available.  Imaging: Xr Knee 3 View Left  Result Date: 01/07/2018 No acute or structural abnormalities    PMFS History: Patient Active Problem List   Diagnosis Date Noted  . Chronic pain of left knee 01/07/2018   History reviewed. No pertinent past medical history.  History reviewed. No pertinent family history.  History reviewed. No pertinent surgical history. Social History   Occupational History  . Not on file  Tobacco Use  . Smoking status: Current Every Day Smoker    Packs/day: 0.50    Types: Cigarettes  .  Smokeless tobacco: Never Used  Substance and Sexual Activity  . Alcohol use: No  . Drug use: No  . Sexual activity: Not on file

## 2018-01-14 ENCOUNTER — Telehealth (INDEPENDENT_AMBULATORY_CARE_PROVIDER_SITE_OTHER): Payer: Self-pay | Admitting: Physician Assistant

## 2018-01-14 NOTE — Telephone Encounter (Signed)
Called patient no answer LMOM.

## 2018-01-14 NOTE — Telephone Encounter (Signed)
Give it two weeks from injection and then let us know if still painful.  Can take up to two weeks for injection to kick in.

## 2018-01-14 NOTE — Telephone Encounter (Signed)
SEE MESSAGE BELOW:

## 2018-01-14 NOTE — Telephone Encounter (Signed)
Patient called stated Lillia AbedLindsay told him if he didn't feel any better to call back and a MRI can be set up.  Please call Patient to advise.

## 2018-01-25 ENCOUNTER — Encounter (HOSPITAL_COMMUNITY): Payer: Self-pay | Admitting: Emergency Medicine

## 2018-01-25 ENCOUNTER — Other Ambulatory Visit: Payer: Self-pay

## 2018-01-25 DIAGNOSIS — J111 Influenza due to unidentified influenza virus with other respiratory manifestations: Secondary | ICD-10-CM | POA: Diagnosis not present

## 2018-01-25 DIAGNOSIS — Z209 Contact with and (suspected) exposure to unspecified communicable disease: Secondary | ICD-10-CM | POA: Diagnosis not present

## 2018-01-25 DIAGNOSIS — F17210 Nicotine dependence, cigarettes, uncomplicated: Secondary | ICD-10-CM | POA: Diagnosis not present

## 2018-01-25 DIAGNOSIS — Z79899 Other long term (current) drug therapy: Secondary | ICD-10-CM | POA: Diagnosis not present

## 2018-01-25 DIAGNOSIS — R05 Cough: Secondary | ICD-10-CM | POA: Diagnosis present

## 2018-01-25 LAB — GROUP A STREP BY PCR: Group A Strep by PCR: NOT DETECTED

## 2018-01-25 NOTE — ED Triage Notes (Signed)
Pt reports having cough and sore throat that started last night. Pt reported taking Alka seltzer Plus before work.

## 2018-01-26 ENCOUNTER — Emergency Department (HOSPITAL_COMMUNITY)
Admission: EM | Admit: 2018-01-26 | Discharge: 2018-01-26 | Disposition: A | Discharge status: Home / Self Care | Payer: Managed Care, Other (non HMO) | Attending: Emergency Medicine | Admitting: Emergency Medicine

## 2018-01-26 DIAGNOSIS — J111 Influenza due to unidentified influenza virus with other respiratory manifestations: Secondary | ICD-10-CM

## 2018-01-26 DIAGNOSIS — R69 Illness, unspecified: Secondary | ICD-10-CM

## 2018-01-26 NOTE — Discharge Instructions (Signed)
Your symptoms are most likely from a virus that has caused an upper respiratory infection. Possible influenza. A viral illness typically peaks on day 2-3 and resolves after one week.  The main treatment approach for a viral upper respiratory infection is to treat the symptoms, support your immune system and prevent spread of illness.  °  °Stay well-hydrated. Rest. You can use over the counter medications to help with symptoms: °600 mg ibuprofen (motrin, aleve, advil) or acetaminophen (tylenol) every 6 hours, around the clock to help with associated fevers, sore throat, headaches, generalized body aches and malaise.  °Oxymetazoline (afrin) intranasal spray once daily for no more than 3 days to help with congestion, after 3 days you can switch to another over-the-counter nasal steroid spray such as fluticasone (flonase) °Allergy medication (loratadine, cetirizine, etc) and phenylephrine (sudafed) help with nasal congestion, runny nose and postnasal drip.   °Dextromethorphan (Delsym) to suppress dry cough  °Guaifenesin (mucinex) to help with built up mucus in chest and productive cough °Wash your hands often to prevent spread.  °  °A viral upper respiratory infection can also worsen and progress into pneumonia.  Monitor your symptoms. Return for persistent fevers, chest pain, productive cough, inability to tolerate fluids despite nausea medicines or dehydration  °

## 2018-01-26 NOTE — ED Provider Notes (Signed)
[facility]-EMERGENCY DEPT Provider Note   CSN: [id number] Arrival date & time: 01/25/18  2120     History   Chief Complaint Chief Complaint  Patient presents with  . Cough  . Sore Throat    HPI Jonathan Nash is a 28 y.o. male here for evaluation of sore throat onset last night. Associated with productive cough, body aches, nausea, vomiting, nasal congestion.  Known sick contacts at work. He went to work today and his boss told him he didn't look well, to come to ER and get a work note to return to work.  Alka seltzer without relief. No alleviating or aggravating factors.   He denies HA, CP, SOB, diarrhea, urinary symptoms. HPI  History reviewed. No pertinent past medical history.  Patient Active Problem List   Diagnosis Date Noted  . Chronic pain of left knee 01/07/2018    History reviewed. No pertinent surgical history.      Home Medications    Prior to Admission medications   Medication Sig Start Date End Date Taking? Authorizing Provider  acetaminophen (TYLENOL) 500 MG tablet Take 1 tablet (500 mg total) by mouth every 6 (six) hours as needed. 12/21/17   Luevenia MaxinFawze, [name], PA-C  clindamycin (CLEOCIN) 300 MG capsule Take 1 capsule (300 mg total) by mouth 3 (three) times daily. Patient not taking: Reported on 11/26/2017 10/02/16   Glynn Octaveancour, Stephen, MD  dicyclomine (BENTYL) 20 MG tablet Take 1 tablet (20 mg total) by mouth 2 (two) times daily as needed for spasms. 11/26/17   Long, Arlyss RepressJoshua G, MD  erythromycin ophthalmic ointment Place a 1/2 inch ribbon of ointment into the lower eyelid. Patient not taking: Reported on 11/26/2017 12/18/11   Sunnie Nielsenpitz, Brian, MD  HYDROcodone-acetaminophen (NORCO/VICODIN) 5-325 MG tablet Take 1 tablet by mouth every 4 (four) hours as needed. Patient not taking: Reported on 11/26/2017 10/02/16   Glynn Octaveancour, Stephen, MD  ibuprofen (ADVIL,MOTRIN) 600 MG tablet Take 1 tablet (600 mg total) by mouth every 6 (six) hours as needed. 12/21/17    Fawze, [name], PA-C  pantoprazole (PROTONIX) 40 MG tablet Take 1 tablet (40 mg total) by mouth daily. 11/26/17 12/26/17  Long, Arlyss RepressJoshua G, MD  sucralfate (CARAFATE) 1 g tablet Take 1 tablet (1 g total) by mouth 3 (three) times daily as needed. 11/26/17   Long, Arlyss RepressJoshua G, MD    Family History History reviewed. No pertinent family history.  Social History Social History   Tobacco Use  . Smoking status: Current Every Day Smoker    Packs/day: 0.50    Types: Cigarettes  . Smokeless tobacco: Never Used  Substance Use Topics  . Alcohol use: No  . Drug use: No     Allergies   Patient has no known allergies.   Review of Systems Review of Systems  HENT: Positive for congestion, rhinorrhea and sore throat.   Respiratory: Positive for cough.   Gastrointestinal: Positive for nausea and vomiting.  Musculoskeletal: Positive for myalgias.  All other systems reviewed and are negative.    Physical Exam Updated Vital Signs BP 125/82   Pulse 78   Temp 98.6 F (37 C) (Oral)   Resp 19   Ht 6' (1.829 m)   Wt 76.2 kg   SpO2 99%   BMI 22.78 kg/m   Physical Exam Vitals signs and nursing note reviewed.  Constitutional:      General: He is not in acute distress.    Appearance: He is well-developed.     Comments: NAD.  HENT:     Head: Normocephalic and atraumatic.     Right Ear: External ear normal.     Left Ear: External ear normal.     Nose: Nose normal.     Mouth/Throat:     Pharynx: Posterior oropharyngeal erythema present.     Tonsils: Swelling: 1+ on the right. 1+ on the left.     Comments: Diffuse erythema to orpharynx. 1+ symmetric tonsils bilaterally. No exudates. No SL fullness. Normal phonation. Tolerating oral secretions.  Eyes:     General: No scleral icterus.    Conjunctiva/sclera: Conjunctivae normal.  Neck:     Musculoskeletal: Normal range of motion and neck supple.     Comments: No tender LAD  Cardiovascular:     Rate and Rhythm: Normal rate and regular rhythm.       Heart sounds: Normal heart sounds.  Pulmonary:     Effort: Pulmonary effort is normal.     Breath sounds: Normal breath sounds. No wheezing.  Musculoskeletal: Normal range of motion.        General: No deformity.  Skin:    General: Skin is warm and dry.     Capillary Refill: Capillary refill takes less than 2 seconds.  Neurological:     Mental Status: He is alert and oriented to person, place, and time.  Psychiatric:        Behavior: Behavior normal.        Thought Content: Thought content normal.        Judgment: Judgment normal.      ED Treatments / Results  Labs (all labs ordered are listed, but only abnormal results are displayed) Labs Reviewed  GROUP A STREP BY PCR    EKG None  Radiology No results found.  Procedures Procedures (including critical care time)  Medications Ordered in ED Medications - No data to display   Initial Impression / Assessment and Plan / ED Course  I have reviewed the triage vital signs and the nursing notes.  Pertinent labs & imaging results that were available during my care of the patient were reviewed by me and considered in my medical decision making (see chart for details).     28 y.o. -year-old male presents with URI like symptoms  1 days. Known sick contacts. On my exam patient is nontoxic appearing, speaking in full sentences, w/o increased WOB. No fever, tachypnea, tachycardia, hypoxia. Lungs are CTAB. I do not think that a CXR is indicated at this time as VS are WNL, there are no signs of consolidation on auscultation and there is no hypoxia. No significant h/o immunocompromise. Doubt bacterial bronchitis or pneumonia.  Rapid strep negative. Given reassuring physical exam, will discharge with symptomatic treatment. Strict ED return precautions given. Patient is aware that a viral URI infection may precede pneumonia or worsening illness. Patient is aware of red flag symptoms to monitor for that would warrant return to the ED  for further reevaluation.   Final Clinical Impressions(s) / ED Diagnoses   Final diagnoses:  Influenza-like illness    ED Discharge Orders    None       Liberty Handy, PA-C 01/26/18 0304    Geoffery Lyons, MD 01/26/18 [phone]

## 2018-02-09 ENCOUNTER — Ambulatory Visit (INDEPENDENT_AMBULATORY_CARE_PROVIDER_SITE_OTHER): Payer: Managed Care, Other (non HMO) | Admitting: Orthopaedic Surgery

## 2018-02-09 ENCOUNTER — Encounter (INDEPENDENT_AMBULATORY_CARE_PROVIDER_SITE_OTHER): Payer: Self-pay | Admitting: Orthopaedic Surgery

## 2018-02-09 DIAGNOSIS — G8929 Other chronic pain: Secondary | ICD-10-CM | POA: Diagnosis not present

## 2018-02-09 DIAGNOSIS — M25562 Pain in left knee: Secondary | ICD-10-CM

## 2018-02-09 NOTE — Addendum Note (Signed)
Addended by: Albertina Parr on: 02/09/2018 04:14 PM   Modules accepted: Orders

## 2018-02-09 NOTE — Progress Notes (Signed)
   Office Visit Note   Patient: Jonathan Nash           Date of Birth: Jul 22, 1990           MRN: [id number] Visit Date: 02/09/2018              Requested by: Jeni Salles, FNP [address] [place], [place] [address] PCP: Jeni Salles, FNP   Assessment & Plan: Visit Diagnoses:  1. Chronic pain of left knee     Plan: At this point patient continues to have symptoms despite current cortisone injection.  With the suspicion is for a medial meniscal tear.  We will obtain an MRI to evaluate for this.  Follow-up after the MRI.  Follow-Up Instructions: Return in about 10 days (around 02/19/2018).   Orders:  No orders of the defined types were placed in this encounter.  No orders of the defined types were placed in this encounter.     Procedures: No procedures performed   Clinical Data: No additional findings.   Subjective: Chief Complaint  Patient presents with  . Left Knee - Pain    Jonathan Nash follows up today for continued left knee pain.  The previous cortisone injection gave him a couple days of relief.  He is had to take several days off from work because of his continued left knee pain.  He continues to have medial sided knee pain.   Review of Systems  Constitutional: Negative.   All other systems reviewed and are negative.    Objective: Vital Signs: There were no vitals taken for this visit.  Physical Exam Vitals signs and nursing note reviewed.  Constitutional:      Appearance: He is well-developed.  Pulmonary:     Effort: Pulmonary effort is normal.  Abdominal:     Palpations: Abdomen is soft.  Skin:    General: Skin is warm.  Neurological:     Mental Status: He is alert and oriented to person, place, and time.  Psychiatric:        Behavior: Behavior normal.        Thought Content: Thought content normal.        Judgment: Judgment normal.     Ortho Exam Left knee exam shows a small joint effusion.  Medial joint  line tenderness. Specialty Comments:  No specialty comments available.  Imaging: No results found.   PMFS History: Patient Active Problem List   Diagnosis Date Noted  . Chronic pain of left knee 01/07/2018   No past medical history on file.  No family history on file.  No past surgical history on file. Social History   Occupational History  . Not on file  Tobacco Use  . Smoking status: Current Every Day Smoker    Packs/day: 0.50    Types: Cigarettes  . Smokeless tobacco: Never Used  Substance and Sexual Activity  . Alcohol use: No  . Drug use: No  . Sexual activity: Not on file

## 2018-02-14 ENCOUNTER — Telehealth (INDEPENDENT_AMBULATORY_CARE_PROVIDER_SITE_OTHER): Payer: Self-pay | Admitting: Orthopaedic Surgery

## 2018-02-14 NOTE — Telephone Encounter (Signed)
PT came in the office requesting a note for Saturday and Sunday - he had to leave work early due to his knee swelling.

## 2018-02-14 NOTE — Telephone Encounter (Signed)
Please advise 

## 2018-02-14 NOTE — Telephone Encounter (Signed)
ok 

## 2018-02-15 ENCOUNTER — Encounter (INDEPENDENT_AMBULATORY_CARE_PROVIDER_SITE_OTHER): Payer: Self-pay

## 2018-02-15 ENCOUNTER — Other Ambulatory Visit: Payer: Managed Care, Other (non HMO)

## 2018-02-15 NOTE — Telephone Encounter (Signed)
Called patient no answer  And could not leave VM

## 2018-02-15 NOTE — Telephone Encounter (Signed)
Ready for pick up

## 2018-02-15 NOTE — Telephone Encounter (Signed)
He doesn't need to be out of work an entire day for the MRI

## 2018-02-15 NOTE — Telephone Encounter (Signed)
Patient would like to know if he can be out of work on Thursday 02/17/2018. States he is scheduled to have MRI that day. Is this okay?

## 2018-02-15 NOTE — Telephone Encounter (Signed)
Pt came in the office to pick up letter for being out Saturday and Sunday - pt also requested to be out of work Thursday due to him having to move his MRI,, due to an insurance mix up

## 2018-02-22 ENCOUNTER — Ambulatory Visit (INDEPENDENT_AMBULATORY_CARE_PROVIDER_SITE_OTHER): Payer: Managed Care, Other (non HMO) | Admitting: Orthopaedic Surgery

## 2018-02-23 ENCOUNTER — Other Ambulatory Visit: Payer: Self-pay

## 2018-02-23 ENCOUNTER — Encounter (HOSPITAL_BASED_OUTPATIENT_CLINIC_OR_DEPARTMENT_OTHER): Payer: Self-pay | Admitting: Emergency Medicine

## 2018-02-23 ENCOUNTER — Emergency Department (HOSPITAL_BASED_OUTPATIENT_CLINIC_OR_DEPARTMENT_OTHER)
Admission: EM | Admit: 2018-02-23 | Discharge: 2018-02-24 | Disposition: A | Discharge status: Home / Self Care | Payer: Managed Care, Other (non HMO) | Attending: Emergency Medicine | Admitting: Emergency Medicine

## 2018-02-23 DIAGNOSIS — M25562 Pain in left knee: Secondary | ICD-10-CM | POA: Insufficient documentation

## 2018-02-23 DIAGNOSIS — F17210 Nicotine dependence, cigarettes, uncomplicated: Secondary | ICD-10-CM | POA: Diagnosis not present

## 2018-02-23 DIAGNOSIS — G8929 Other chronic pain: Secondary | ICD-10-CM | POA: Diagnosis not present

## 2018-02-23 NOTE — ED Triage Notes (Signed)
Pt states his left knee is very painful 8/10 for the past month. Denies any injury in the past few days.

## 2018-02-24 NOTE — ED Provider Notes (Signed)
[facility] EMERGENCY DEPARTMENT Provider Note  CSN: [id number] Arrival date & time: 02/23/18 2328  Chief Complaint(s) Knee Pain  HPI Jonathan Nash is a 28 y.o. male with a history of prior left knee ligamentous injury that is being followed by Dr.Xu of orthopedic surgery who is suspicious for a meniscal tear causing the patient's current left knee pain.  He presents today with continued pain.  He denies any acute trauma or exacerbation.  Pain is worse with range of motion and ambulation.  No swelling or redness.  No numbness or weakness.  Primarily he is here for note for work.  Had a follow-up appointment with Dr.Xu, on January 25 but missed that appointment.  HPI  Past Medical History History reviewed. No pertinent past medical history. Patient Active Problem List   Diagnosis Date Noted  . Chronic pain of left knee 01/07/2018   Home Medication(s) Prior to Admission medications   Medication Sig Start Date End Date Taking? Authorizing Provider  acetaminophen (TYLENOL) 500 MG tablet Take 1 tablet (500 mg total) by mouth every 6 (six) hours as needed. 12/21/17   Luevenia Maxin, [name], PA-C  clindamycin (CLEOCIN) 300 MG capsule Take 1 capsule (300 mg total) by mouth 3 (three) times daily. 10/02/16   Rancour, [name], MD  dicyclomine (BENTYL) 20 MG tablet Take 1 tablet (20 mg total) by mouth 2 (two) times daily as needed for spasms. 11/26/17   Long, [name], MD  erythromycin ophthalmic ointment Place a 1/2 inch ribbon of ointment into the lower eyelid. 12/18/11   [name], MD  HYDROcodone-acetaminophen (NORCO/VICODIN) 5-325 MG tablet Take 1 tablet by mouth every 4 (four) hours as needed. 10/02/16   Rancour, [name], MD  ibuprofen (ADVIL,MOTRIN) 600 MG tablet Take 1 tablet (600 mg total) by mouth every 6 (six) hours as needed. 12/21/17   Fawze, [name], PA-C  pantoprazole (PROTONIX) 40 MG tablet Take 1 tablet (40 mg total) by mouth daily. 11/26/17 12/26/17  Long, [name], MD    sucralfate (CARAFATE) 1 g tablet Take 1 tablet (1 g total) by mouth 3 (three) times daily as needed. 11/26/17   Long, [name], MD                                                                                                                                    Past Surgical History History reviewed. No pertinent surgical history. Family History No family history on file.  Social History Social History   Tobacco Use  . Smoking status: Current Every Day Smoker    Packs/day: 0.50    Types: Cigarettes  . Smokeless tobacco: Never Used  Substance Use Topics  . Alcohol use: No  . Drug use: No   Allergies Patient has no known allergies.  Review of Systems Review of Systems As noted in HPI Physical Exam Vital Signs  I have reviewed the triage vital signs BP (!) 153/87 (BP  Location: Right Arm)   Pulse 69   Temp 98.5 F (36.9 C) (Oral)   Ht 6' (1.829 m)   Wt 76.2 kg   SpO2 100%   BMI 22.78 kg/m   Physical Exam Vitals signs reviewed.  Constitutional:      General: He is not in acute distress.    Appearance: He is well-developed. He is not diaphoretic.  HENT:     Head: Normocephalic and atraumatic.     Jaw: No trismus.     Right Ear: External ear normal.     Left Ear: External ear normal.     Nose: Nose normal.  Eyes:     General: No scleral icterus.    Conjunctiva/sclera: Conjunctivae normal.  Neck:     Musculoskeletal: Normal range of motion.     Trachea: Phonation normal.  Cardiovascular:     Rate and Rhythm: Normal rate and regular rhythm.  Pulmonary:     Effort: Pulmonary effort is normal. No respiratory distress.     Breath sounds: No stridor.  Abdominal:     General: There is no distension.  Musculoskeletal: Normal range of motion.     Left knee: He exhibits normal range of motion, no swelling, no effusion and no deformity. Tenderness found. Medial joint line tenderness noted.       Legs:  Neurological:     Mental Status: He is alert and oriented to  person, place, and time.  Psychiatric:        Behavior: Behavior normal.     ED Results and Treatments Labs (all labs ordered are listed, but only abnormal results are displayed) Labs Reviewed - No data to display                                                                                                                       EKG  EKG Interpretation  Date/Time:    Ventricular Rate:    PR Interval:    QRS Duration:   QT Interval:    QTC Calculation:   R Axis:     Text Interpretation:        Radiology No results found. Pertinent labs & imaging results that were available during my care of the patient were reviewed by me and considered in my medical decision making (see chart for details).  Medications Ordered in ED Medications - No data to display  Procedures Procedures  (including critical care time)  Medical Decision Making / ED Course I have reviewed the nursing notes for this encounter and the patient's prior records (if available in EHR or on provided paperwork).    Persistent left knee pain currently being followed up by orthopedic surgery.  No acute exacerbations or recent trauma requiring imaging at this time.  Low suspicion for septic arthritis.  The patient appears reasonably screened and/or stabilized for discharge and I doubt any other medical condition or other [facility] requiring further screening, evaluation, or treatment in the ED at this time prior to discharge.  The patient is safe for discharge with strict return precautions.   Final Clinical Impression(s) / ED Diagnoses Final diagnoses:  Chronic pain of left knee    Disposition: Discharge  Condition: Good  I have discussed the results, Dx and Tx plan with the patient who expressed understanding and agree(s) with the plan. Discharge instructions discussed at great  length. The patient was given strict return precautions who verbalized understanding of the instructions. No further questions at time of discharge.    ED Discharge Orders    None       Follow Up: Tarry KosXu, Naiping M, MD [address] [place] [id number] [phone]  Schedule an appointment as soon as possible for a visit      This chart was dictated using voice recognition software.  Despite best efforts to proofread,  errors can occur which can change the documentation meaning.   Nira Connardama, Himmat Enberg Eduardo, MD 02/24/18 [phone]

## 2018-02-25 ENCOUNTER — Telehealth (INDEPENDENT_AMBULATORY_CARE_PROVIDER_SITE_OTHER): Payer: Self-pay | Admitting: Orthopaedic Surgery

## 2018-02-25 NOTE — Telephone Encounter (Signed)
Ok Chief Operating Officer.  When is his MRI scheduled for?

## 2018-02-25 NOTE — Telephone Encounter (Signed)
Pt came in the office asking if Dr. Roda Shutters can put him on light duty

## 2018-02-25 NOTE — Telephone Encounter (Signed)
See message below °

## 2018-02-28 ENCOUNTER — Encounter (INDEPENDENT_AMBULATORY_CARE_PROVIDER_SITE_OTHER): Payer: Self-pay

## 2018-02-28 NOTE — Telephone Encounter (Signed)
Still pending. Waiting on Medicaid.

## 2018-03-01 ENCOUNTER — Encounter (INDEPENDENT_AMBULATORY_CARE_PROVIDER_SITE_OTHER): Payer: Self-pay

## 2018-03-21 ENCOUNTER — Other Ambulatory Visit: Payer: Self-pay

## 2018-03-21 ENCOUNTER — Emergency Department (HOSPITAL_BASED_OUTPATIENT_CLINIC_OR_DEPARTMENT_OTHER)
Admission: EM | Admit: 2018-03-21 | Discharge: 2018-03-21 | Disposition: A | Discharge status: Home / Self Care | Payer: Managed Care, Other (non HMO) | Attending: Emergency Medicine | Admitting: Emergency Medicine

## 2018-03-21 ENCOUNTER — Encounter (HOSPITAL_BASED_OUTPATIENT_CLINIC_OR_DEPARTMENT_OTHER): Payer: Self-pay | Admitting: Emergency Medicine

## 2018-03-21 DIAGNOSIS — Z79899 Other long term (current) drug therapy: Secondary | ICD-10-CM | POA: Diagnosis not present

## 2018-03-21 DIAGNOSIS — B349 Viral infection, unspecified: Secondary | ICD-10-CM | POA: Insufficient documentation

## 2018-03-21 DIAGNOSIS — J029 Acute pharyngitis, unspecified: Secondary | ICD-10-CM | POA: Diagnosis not present

## 2018-03-21 DIAGNOSIS — F17210 Nicotine dependence, cigarettes, uncomplicated: Secondary | ICD-10-CM | POA: Diagnosis not present

## 2018-03-21 LAB — GROUP A STREP BY PCR: GROUP A STREP BY PCR: NOT DETECTED

## 2018-03-21 MED ORDER — IBUPROFEN 800 MG PO TABS
800.0000 mg | ORAL_TABLET | Freq: Once | ORAL | Status: AC
  Administered 2018-03-21: 800 mg via ORAL
  Filled 2018-03-21: qty 1

## 2018-03-21 MED ORDER — DEXAMETHASONE SODIUM PHOSPHATE 10 MG/ML IJ SOLN
10.0000 mg | Freq: Once | INTRAMUSCULAR | Status: AC
  Administered 2018-03-21: 10 mg via INTRAMUSCULAR
  Filled 2018-03-21: qty 1

## 2018-03-21 NOTE — ED Provider Notes (Addendum)
[facility] EMERGENCY DEPARTMENT Provider Note   CSN: [id number] Arrival date & time: 03/21/18  0302    History   Chief Complaint Chief Complaint  Patient presents with  . Sore Throat    HPI Jonathan Nash is a 28 y.o. male.     The history is provided by the patient.  Sore Throat  This is a new problem. The current episode started 2 days ago. The problem occurs constantly. The problem has not changed since onset.Pertinent negatives include no chest pain, no abdominal pain, no headaches and no shortness of breath. Nothing aggravates the symptoms. Nothing relieves the symptoms. Treatments tried: ibuprofen. The treatment provided no relief.  No f/c/r.  No changes in voice.  Is able to eat and drink without pain or difficulty.  No SOB.    History reviewed. No pertinent past medical history.  Patient Active Problem List   Diagnosis Date Noted  . Chronic pain of left knee 01/07/2018    History reviewed. No pertinent surgical history.      Home Medications    Prior to Admission medications   Medication Sig Start Date End Date Taking? Authorizing Provider  acetaminophen (TYLENOL) 500 MG tablet Take 1 tablet (500 mg total) by mouth every 6 (six) hours as needed. 12/21/17   Luevenia Maxin, [name], PA-C  clindamycin (CLEOCIN) 300 MG capsule Take 1 capsule (300 mg total) by mouth 3 (three) times daily. 10/02/16   Rancour, [name], MD  dicyclomine (BENTYL) 20 MG tablet Take 1 tablet (20 mg total) by mouth 2 (two) times daily as needed for spasms. 11/26/17   Long, [name], MD  erythromycin ophthalmic ointment Place a 1/2 inch ribbon of ointment into the lower eyelid. 12/18/11   [name], MD  HYDROcodone-acetaminophen (NORCO/VICODIN) 5-325 MG tablet Take 1 tablet by mouth every 4 (four) hours as needed. 10/02/16   Rancour, [name], MD  ibuprofen (ADVIL,MOTRIN) 600 MG tablet Take 1 tablet (600 mg total) by mouth every 6 (six) hours as needed. 12/21/17   Fawze, [name], PA-C    pantoprazole (PROTONIX) 40 MG tablet Take 1 tablet (40 mg total) by mouth daily. 11/26/17 12/26/17  Long, [name], MD  sucralfate (CARAFATE) 1 g tablet Take 1 tablet (1 g total) by mouth 3 (three) times daily as needed. 11/26/17   Long, [name], MD    Family History No family history on file.  Social History Social History   Tobacco Use  . Smoking status: Current Every Day Smoker    Packs/day: 0.50    Types: Cigarettes  . Smokeless tobacco: Never Used  Substance Use Topics  . Alcohol use: No  . Drug use: No     Allergies   Patient has no known allergies.   Review of Systems Review of Systems  Constitutional: Negative for fever.  HENT: Positive for sore throat. Negative for drooling, facial swelling, trouble swallowing and voice change.   Respiratory: Negative for shortness of breath.   Cardiovascular: Negative for chest pain.  Gastrointestinal: Negative for abdominal pain.  Neurological: Negative for headaches.  All other systems reviewed and are negative.    Physical Exam Updated Vital Signs BP 127/75 (BP Location: Right Arm)   Pulse 76   Temp 98.4 F (36.9 C) (Oral)   Resp 16   SpO2 100%   Physical Exam Vitals signs and nursing note reviewed.  Constitutional:      General: He is not in acute distress.    Appearance: He is normal weight.  HENT:     Head: Normocephalic and atraumatic.     Nose: Nose normal.     Mouth/Throat:     Mouth: Mucous membranes are moist.     Pharynx: Oropharynx is clear. Uvula midline. No pharyngeal swelling, oropharyngeal exudate, posterior oropharyngeal erythema or uvula swelling.     Tonsils: No tonsillar exudate or tonsillar abscesses.  Eyes:     Pupils: Pupils are equal, round, and reactive to light.  Neck:     Musculoskeletal: Normal range of motion and neck supple. No neck rigidity.     Comments: Intact phonation   Cardiovascular:     Rate and Rhythm: Normal rate and regular rhythm.     Pulses: Normal pulses.      Heart sounds: Normal heart sounds.  Pulmonary:     Effort: Pulmonary effort is normal.     Breath sounds: Normal breath sounds.  Abdominal:     General: Abdomen is flat. Bowel sounds are normal.     Tenderness: There is no abdominal tenderness.  Musculoskeletal: Normal range of motion.  Lymphadenopathy:     Cervical: No cervical adenopathy.  Skin:    General: Skin is warm and dry.     Capillary Refill: Capillary refill takes less than 2 seconds.  Neurological:     General: No focal deficit present.     Mental Status: He is alert and oriented to person, place, and time.  Psychiatric:        Mood and Affect: Mood normal.        Behavior: Behavior normal.      ED Treatments / Results  Labs (all labs ordered are listed, but only abnormal results are displayed) Results for orders placed or performed during the hospital encounter of 03/21/18  Group A Strep by PCR  Result Value Ref Range   Group A Strep by PCR NOT DETECTED NOT DETECTED   No results found.  Procedures Procedures (including critical care time)  Medications Ordered in ED Medications  ibuprofen (ADVIL,MOTRIN) tablet 800 mg (800 mg Oral Given 03/21/18 0348)  dexamethasone (DECADRON) injection 10 mg (10 mg Intramuscular Given 03/21/18 0348)     Decadron given for pain relief.  Symptoms likely viral.  No signs of deep infection.  There is no indication for labs or imaging at this time.    Final Clinical Impressions(s) / ED Diagnoses   Final diagnoses:  Viral pharyngitis    Return for pain, intractable cough, fevers >100.4 unrelieved by medication, shortness of breath, intractable vomiting, chest pain, shortness of breath, weakness numbness, changes in speech, facial asymmetry,abdominal pain, passing out,Inability to tolerate liquids or food, cough, altered mental status or any concerns. No signs of systemic illness or infection. The patient is nontoxic-appearing on exam and vital signs are within normal  limits.   I have reviewed the triage vital signs and the nursing notes. Pertinent labs &imaging results that were available during my care of the patient were reviewed by me and considered in my medical decision making (see chart for details).  After history, exam, and medical workup I feel the patient has been appropriately medically screened and is safe for discharge home. Pertinent diagnoses were discussed with the patient. Patient was given return precautions.    Casidy Alberta, MD 03/21/18 0932    Cy Blamer, MD 03/21/18 6712

## 2018-03-21 NOTE — ED Triage Notes (Signed)
Sore throat and headache for several days. No fevers. States "I can't eat."

## 2018-03-21 NOTE — ED Notes (Signed)
Patient left at this time with all belongings. 

## 2018-03-21 NOTE — ED Notes (Signed)
PO challenge given. Swallowed meds without issues.

## 2018-03-24 ENCOUNTER — Emergency Department (HOSPITAL_COMMUNITY)
Admission: EM | Admit: 2018-03-24 | Discharge: 2018-03-24 | Disposition: A | Discharge status: Home / Self Care | Payer: Managed Care, Other (non HMO) | Attending: Emergency Medicine | Admitting: Emergency Medicine

## 2018-03-24 ENCOUNTER — Encounter (HOSPITAL_COMMUNITY): Payer: Self-pay | Admitting: Emergency Medicine

## 2018-03-24 ENCOUNTER — Other Ambulatory Visit: Payer: Self-pay

## 2018-03-24 DIAGNOSIS — J029 Acute pharyngitis, unspecified: Secondary | ICD-10-CM | POA: Insufficient documentation

## 2018-03-24 DIAGNOSIS — F17210 Nicotine dependence, cigarettes, uncomplicated: Secondary | ICD-10-CM | POA: Diagnosis not present

## 2018-03-24 DIAGNOSIS — Z79899 Other long term (current) drug therapy: Secondary | ICD-10-CM | POA: Diagnosis not present

## 2018-03-24 LAB — MONONUCLEOSIS SCREEN: Mono Screen: POSITIVE — AB

## 2018-03-24 LAB — GROUP A STREP BY PCR: GROUP A STREP BY PCR: NOT DETECTED

## 2018-03-24 MED ORDER — KETOROLAC TROMETHAMINE 60 MG/2ML IM SOLN
60.0000 mg | Freq: Once | INTRAMUSCULAR | Status: AC
  Administered 2018-03-24: 60 mg via INTRAMUSCULAR
  Filled 2018-03-24: qty 2

## 2018-03-24 MED ORDER — ALUM & MAG HYDROXIDE-SIMETH 200-200-20 MG/5ML PO SUSP
30.0000 mL | Freq: Once | ORAL | Status: AC
  Administered 2018-03-24: 30 mL via ORAL
  Filled 2018-03-24: qty 30

## 2018-03-24 MED ORDER — LIDOCAINE VISCOUS HCL 2 % MT SOLN
15.0000 mL | Freq: Once | OROMUCOSAL | Status: AC
  Administered 2018-03-24: 15 mL via ORAL
  Filled 2018-03-24: qty 15

## 2018-03-24 MED ORDER — MAGIC MOUTHWASH W/LIDOCAINE: 5.0000 mL | Freq: Four times a day (QID) | ORAL | 1 refills | Status: DC | PRN

## 2018-03-24 NOTE — ED Triage Notes (Signed)
Patient complaining of sore throat for five days. Patient states he is having difficulty breathing. Patient states that he thinks he has strep.

## 2018-03-24 NOTE — ED Provider Notes (Signed)
[facility]-EMERGENCY DEPT Provider Note   CSN: [id number] Arrival date & time: 03/24/18  0236    History   Chief Complaint Chief Complaint  Patient presents with  . Sore Throat    HPI Jonathan Nash is a 28 y.o. male.     28 year old male presents to the emergency department for evaluation of 5 days of sore throat.  He has had increased pain with swallowing.  No relief from over-the-counter ibuprofen.  Expresses that he feels some difficulty breathing, though he has not had any difficulty swallowing or drooling.  No fevers, abdominal pain, vomiting.  Was evaluated 3 days ago with a negative strep screen.  Feels this test was not accurate.  Is unaware of any sick contacts.  The history is provided by the patient. No language interpreter was used.  Sore Throat     History reviewed. No pertinent past medical history.  Patient Active Problem List   Diagnosis Date Noted  . Chronic pain of left knee 01/07/2018    History reviewed. No pertinent surgical history.      Home Medications    Prior to Admission medications   Medication Sig Start Date End Date Taking? Authorizing Provider  acetaminophen (TYLENOL) 500 MG tablet Take 1 tablet (500 mg total) by mouth every 6 (six) hours as needed. 12/21/17   Luevenia Maxin, [name], PA-C  clindamycin (CLEOCIN) 300 MG capsule Take 1 capsule (300 mg total) by mouth 3 (three) times daily. 10/02/16   Rancour, [name], MD  dicyclomine (BENTYL) 20 MG tablet Take 1 tablet (20 mg total) by mouth 2 (two) times daily as needed for spasms. 11/26/17   Long, [name], MD  erythromycin ophthalmic ointment Place a 1/2 inch ribbon of ointment into the lower eyelid. 12/18/11   [name], MD  HYDROcodone-acetaminophen (NORCO/VICODIN) 5-325 MG tablet Take 1 tablet by mouth every 4 (four) hours as needed. 10/02/16   Rancour, [name], MD  ibuprofen (ADVIL,MOTRIN) 600 MG tablet Take 1 tablet (600 mg total) by mouth every 6 (six) hours as  needed. 12/21/17   Fawze, [name], PA-C  magic mouthwash w/lidocaine SOLN Take 5 mLs by mouth 4 (four) times daily as needed for mouth pain. 03/24/18   [name], PA-C  pantoprazole (PROTONIX) 40 MG tablet Take 1 tablet (40 mg total) by mouth daily. 11/26/17 12/26/17  Long, [name], MD  sucralfate (CARAFATE) 1 g tablet Take 1 tablet (1 g total) by mouth 3 (three) times daily as needed. 11/26/17   Long, [name], MD    Family History History reviewed. No pertinent family history.  Social History Social History   Tobacco Use  . Smoking status: Current Every Day Smoker    Packs/day: 0.50    Types: Cigarettes  . Smokeless tobacco: Never Used  Substance Use Topics  . Alcohol use: No  . Drug use: No     Allergies   Patient has no known allergies.   Review of Systems Review of Systems Ten systems reviewed and are negative for acute change, except as noted in the HPI.    Physical Exam Updated Vital Signs BP 133/79 (BP Location: Left Arm)   Temp 99.7 F (37.6 C) (Oral)   Resp 16   Ht 6' (1.829 m)   Wt 77.6 kg   SpO2 99%   BMI 23.19 kg/m   Physical Exam Vitals signs and nursing note reviewed.  Constitutional:      General: He is not in acute distress.  Appearance: He is well-developed. He is not diaphoretic.     Comments: Nontoxic appearing and in NAD  HENT:     Head: Normocephalic and atraumatic.     Mouth/Throat:     Comments: Mild tonsillar enlargement with punctate exudates bilaterally.  Uvula midline.  Patient tolerating secretions without difficulty.  Normal phonation.  No tripoding or stridor. Eyes:     General: No scleral icterus.    Conjunctiva/sclera: Conjunctivae normal.  Neck:     Musculoskeletal: Normal range of motion.     Comments: No meningismus Pulmonary:     Effort: Pulmonary effort is normal. No respiratory distress.     Comments: Respirations even and unlabored Musculoskeletal: Normal range of motion.  Lymphadenopathy:     Cervical: Cervical  adenopathy (tender, tonsillar) present.  Skin:    General: Skin is warm and dry.     Coloration: Skin is not pale.     Findings: No erythema or rash.  Neurological:     Mental Status: He is alert and oriented to person, place, and time.  Psychiatric:        Behavior: Behavior normal.      ED Treatments / Results  Labs (all labs ordered are listed, but only abnormal results are displayed) Labs Reviewed  GROUP A STREP BY PCR  MONONUCLEOSIS SCREEN    EKG None  Radiology No results found.  Procedures Procedures (including critical care time)  Medications Ordered in ED Medications  ketorolac (TORADOL) injection 60 mg (60 mg Intramuscular Given 03/24/18 0445)  alum & mag hydroxide-simeth (MAALOX/MYLANTA) 200-200-20 MG/5ML suspension 30 mL (30 mLs Oral Given 03/24/18 0444)    And  lidocaine (XYLOCAINE) 2 % viscous mouth solution 15 mL (15 mLs Oral Given 03/24/18 0444)     Initial Impression / Assessment and Plan / ED Course  I have reviewed the triage vital signs and the nursing notes.  Pertinent labs & imaging results that were available during my care of the patient were reviewed by me and considered in my medical decision making (see chart for details).        28 year old male presents for persistent sore throat.  Had a negative strep screen 3 days ago.  Repeat strep today is negative.  He is afebrile, tolerating secretions.  No tripoding or stridor.  Normal phonation.  Uvula midline without other evidence of peritonsillar abscess.  No nuchal rigidity or meningismus on exam.  He was treated in the ED with Toradol as well as Maalox with lidocaine.  Will discharge with a prescription for Magic mouthwash.  Recommended continued use of NSAIDs and Tylenol.  Monospot pending; appropriate for outpatient f/u on MyChart.  Return precautions discussed and provided. Patient discharged in stable condition with no unaddressed concerns.   Final Clinical Impressions(s) / ED Diagnoses    Final diagnoses:  Viral pharyngitis    ED Discharge Orders         Ordered    magic mouthwash w/lidocaine SOLN  4 times daily PRN     03/24/18 0440           [name], PA-C 03/24/18 0503    Molpus, Jonny Ruiz, MD 03/24/18 [phone]

## 2018-03-24 NOTE — Discharge Instructions (Signed)
Your strep screen today was negative.  Your symptoms are due to a viral illness which will resolve on its own.  Continue 600 mg ibuprofen every 6 hours for sore throat.  You may also take 1000 mg Tylenol every 8 hours.  Use Magic mouthwash as prescribed for additional pain control.  You may also try the use of over-the-counter Chloraseptic spray.  If you are pending mono screen is positive, be sure to avoid all contact sports or strenuous activity.  You may return to the ED for new or concerning symptoms.

## 2018-03-26 ENCOUNTER — Other Ambulatory Visit: Payer: Self-pay

## 2018-03-26 ENCOUNTER — Emergency Department (HOSPITAL_COMMUNITY)
Admission: EM | Admit: 2018-03-26 | Discharge: 2018-03-26 | Disposition: A | Discharge status: Home / Self Care | Payer: Managed Care, Other (non HMO) | Attending: Emergency Medicine | Admitting: Emergency Medicine

## 2018-03-26 ENCOUNTER — Encounter (HOSPITAL_COMMUNITY): Payer: Self-pay | Admitting: Emergency Medicine

## 2018-03-26 DIAGNOSIS — F17210 Nicotine dependence, cigarettes, uncomplicated: Secondary | ICD-10-CM | POA: Diagnosis not present

## 2018-03-26 DIAGNOSIS — J029 Acute pharyngitis, unspecified: Secondary | ICD-10-CM | POA: Diagnosis present

## 2018-03-26 DIAGNOSIS — B2790 Infectious mononucleosis, unspecified without complication: Secondary | ICD-10-CM

## 2018-03-26 DIAGNOSIS — Z79899 Other long term (current) drug therapy: Secondary | ICD-10-CM | POA: Diagnosis not present

## 2018-03-26 MED ORDER — KETOROLAC TROMETHAMINE 60 MG/2ML IM SOLN
60.0000 mg | Freq: Once | INTRAMUSCULAR | Status: AC
  Administered 2018-03-26: 60 mg via INTRAMUSCULAR
  Filled 2018-03-26: qty 2

## 2018-03-26 MED ORDER — KETOROLAC TROMETHAMINE 10 MG PO TABS: 10.0000 mg | ORAL_TABLET | Freq: Three times a day (TID) | ORAL | 0 refills | Status: DC | PRN

## 2018-03-26 NOTE — ED Provider Notes (Signed)
[name] Provider Note   CSN: [id number] Arrival date & time: 03/26/18  2215    History   Chief Complaint No chief complaint on file.   HPI Jonathan Nash is a 28 y.o. male.     28 year old male presents to the emergency department for persistent sore throat.  I saw this patient 2 days ago when he presented for these complaints.  Endorses some improvement in his dysphagia following IM Toradol.  He is requesting an additional shot.  Notes a subjective fever earlier today.  He has been using Tylenol with little symptomatic improvement.  Denies any nausea, vomiting, abdominal pain, SOB.  He had a mononucleosis drawn at his prior encounter.  This resulted positive.  The history is provided by the patient. No language interpreter was used.    History reviewed. No pertinent past medical history.  Patient Active Problem List   Diagnosis Date Noted  . Chronic pain of left knee 01/07/2018    History reviewed. No pertinent surgical history.      Home Medications    Prior to Admission medications   Medication Sig Start Date End Date Taking? Authorizing Provider  acetaminophen (TYLENOL) 500 MG tablet Take 1 tablet (500 mg total) by mouth every 6 (six) hours as needed. 12/21/17   Luevenia Maxin, [name], PA-C  clindamycin (CLEOCIN) 300 MG capsule Take 1 capsule (300 mg total) by mouth 3 (three) times daily. 10/02/16   Rancour, [name], MD  dicyclomine (BENTYL) 20 MG tablet Take 1 tablet (20 mg total) by mouth 2 (two) times daily as needed for spasms. 11/26/17   Long, [name], MD  erythromycin ophthalmic ointment Place a 1/2 inch ribbon of ointment into the lower eyelid. 12/18/11   [name], MD  HYDROcodone-acetaminophen (NORCO/VICODIN) 5-325 MG tablet Take 1 tablet by mouth every 4 (four) hours as needed. 10/02/16   Rancour, [name], MD  ibuprofen (ADVIL,MOTRIN) 600 MG tablet Take 1 tablet (600 mg total) by mouth every 6 (six) hours as needed. 12/21/17    Fawze, [name], PA-C  ketorolac (TORADOL) 10 MG tablet Take 1 tablet (10 mg total) by mouth every 8 (eight) hours as needed for severe pain. 03/26/18   [name], PA-C  magic mouthwash w/lidocaine SOLN Take 5 mLs by mouth 4 (four) times daily as needed for mouth pain. 03/24/18   [name], PA-C  pantoprazole (PROTONIX) 40 MG tablet Take 1 tablet (40 mg total) by mouth daily. 11/26/17 12/26/17  Long, [name], MD  sucralfate (CARAFATE) 1 g tablet Take 1 tablet (1 g total) by mouth 3 (three) times daily as needed. 11/26/17   Long, [name], MD    Family History History reviewed. No pertinent family history.  Social History Social History   Tobacco Use  . Smoking status: Current Every Day Smoker    Packs/day: 0.50    Types: Cigarettes  . Smokeless tobacco: Never Used  Substance Use Topics  . Alcohol use: No  . Drug use: No     Allergies   Patient has no known allergies.   Review of Systems Review of Systems Ten systems reviewed and are negative for acute change, except as noted in the HPI.    Physical Exam Updated Vital Signs BP 132/88 (BP Location: Right Arm)   Pulse 67   Temp 97.7 F (36.5 C) (Oral)   Resp 18   Ht 6' (1.829 m)   Wt 77.1 kg   SpO2 98%   BMI 23.06 kg/m  Physical Exam Vitals signs and nursing note reviewed.  Constitutional:      General: He is not in acute distress.    Appearance: He is well-developed. He is not diaphoretic.     Comments: Nontoxic appearing  HENT:     Head: Normocephalic and atraumatic.     Mouth/Throat:     Comments: Tolerating secretions. Normal phonation. Eyes:     General: No scleral icterus.    Conjunctiva/sclera: Conjunctivae normal.  Neck:     Musculoskeletal: Normal range of motion.     Comments: No meningismus Pulmonary:     Effort: Pulmonary effort is normal. No respiratory distress.     Comments: Respirations even and unlabored Abdominal:     General: There is no distension.  Musculoskeletal: Normal range of  motion.  Skin:    General: Skin is warm and dry.     Coloration: Skin is not pale.     Findings: No erythema or rash.  Neurological:     Mental Status: He is alert and oriented to person, place, and time.  Psychiatric:        Behavior: Behavior normal.      ED Treatments / Results  Labs (all labs ordered are listed, but only abnormal results are displayed) Labs Reviewed - No data to display  EKG None  Radiology No results found.  Procedures Procedures (including critical care time)  Medications Ordered in ED Medications  ketorolac (TORADOL) injection 60 mg (has no administration in time range)     Initial Impression / Assessment and Plan / ED Course  I have reviewed the triage vital signs and the nursing notes.  Pertinent labs & imaging results that were available during my care of the patient were reviewed by me and considered in my medical decision making (see chart for details).        28 year old presenting for persistent dysphasia.  Mononucleosis positive.  He is tolerating secretions with normal phonation.  No angioedema appreciated.  Vital signs reassuring without fever or hypoxia.  He is requesting an additional IM shot of Toradol which was provided.  He has follow-up scheduled with his primary care doctor on Monday.  Return precautions discussed and provided. Patient discharged in stable condition with no unaddressed concerns.   Final Clinical Impressions(s) / ED Diagnoses   Final diagnoses:  Infectious mononucleosis without complication, infectious mononucleosis due to unspecified organism    ED Discharge Orders         Ordered    ketorolac (TORADOL) 10 MG tablet  Every 8 hours PRN     03/26/18 2237           [name], PA-C 03/26/18 2245    [name], MD 03/26/18 2312

## 2018-03-26 NOTE — ED Triage Notes (Addendum)
Patient states that he is still having a sore throat. Patient wants another shot and a note. Patient states that he had a fever earlier.

## 2018-03-29 ENCOUNTER — Telehealth (INDEPENDENT_AMBULATORY_CARE_PROVIDER_SITE_OTHER): Payer: Self-pay | Admitting: *Deleted

## 2018-03-29 NOTE — Telephone Encounter (Signed)
IC pt and lvm stating his insurance [employer] has approved the MRI of his knee and advised him to call [facility] to reschedule appt, I am also going to call [facility] and inform them his insurance approved and they will call pt as well.

## 2018-05-08 ENCOUNTER — Emergency Department: Payer: Managed Care, Other (non HMO)

## 2018-05-08 ENCOUNTER — Encounter: Payer: Self-pay | Admitting: Emergency Medicine

## 2018-05-08 ENCOUNTER — Emergency Department
Admission: EM | Admit: 2018-05-08 | Discharge: 2018-05-08 | Disposition: A | Discharge status: Home / Self Care | Payer: Managed Care, Other (non HMO) | Attending: Emergency Medicine | Admitting: Emergency Medicine

## 2018-05-08 ENCOUNTER — Other Ambulatory Visit: Payer: Self-pay

## 2018-05-08 DIAGNOSIS — G8929 Other chronic pain: Secondary | ICD-10-CM | POA: Insufficient documentation

## 2018-05-08 DIAGNOSIS — M25562 Pain in left knee: Secondary | ICD-10-CM | POA: Insufficient documentation

## 2018-05-08 DIAGNOSIS — F17210 Nicotine dependence, cigarettes, uncomplicated: Secondary | ICD-10-CM | POA: Diagnosis not present

## 2018-05-08 MED ORDER — ETODOLAC 400 MG PO TABS: 400.0000 mg | ORAL_TABLET | Freq: Two times a day (BID) | ORAL | 0 refills | Status: DC

## 2018-05-08 MED ORDER — KETOROLAC TROMETHAMINE 30 MG/ML IJ SOLN
30.0000 mg | Freq: Once | INTRAMUSCULAR | Status: AC
  Administered 2018-05-08: 30 mg via INTRAMUSCULAR
  Filled 2018-05-08: qty 1

## 2018-05-08 NOTE — ED Notes (Signed)

## 2018-05-08 NOTE — ED Provider Notes (Signed)
[facility] Emergency Department Provider Note  ___________________________________________   First MD Initiated Contact with Patient 05/08/18 1346     (approximate)  I have reviewed the triage vital signs and the nursing notes.   HISTORY  Chief Complaint Knee Pain  HPI Jonathan Nash is a 28 y.o. male presents to the ED with complaint of left knee pain.  Patient states that he stepped down off of a pallet and felt his knee pop.  Patient has a history of an injury to his knee several years ago.  He denies any other injuries.  He has not been taking any over-the-counter medication for his pain.  Currently rates pain as an 8 out of 10.    Patient was also seen and the ED in [facility] in January 2024 chronic left knee pain.  He was given the name of a orthopedist to follow-up with but states that he never made an appointment.  History reviewed. No pertinent past medical history.  Patient Active Problem List   Diagnosis Date Noted  . Chronic pain of left knee 01/07/2018    History reviewed. No pertinent surgical history.  Prior to Admission medications   Medication Sig Start Date End Date Taking? Authorizing Provider  acetaminophen (TYLENOL) 500 MG tablet Take 1 tablet (500 mg total) by mouth every 6 (six) hours as needed. 12/21/17   Fawze, [name], PA-C  dicyclomine (BENTYL) 20 MG tablet Take 1 tablet (20 mg total) by mouth 2 (two) times daily as needed for spasms. 11/26/17   Long, [name], MD  erythromycin ophthalmic ointment Place a 1/2 inch ribbon of ointment into the lower eyelid. 12/18/11   [name], MD  etodolac (LODINE) 400 MG tablet Take 1 tablet (400 mg total) by mouth 2 (two) times daily. 05/08/18   Tommi Rumps, PA-C  magic mouthwash w/lidocaine SOLN Take 5 mLs by mouth 4 (four) times daily as needed for mouth pain. 03/24/18   [name], PA-C  pantoprazole (PROTONIX) 40 MG tablet Take 1 tablet (40 mg total) by mouth daily. 11/26/17  12/26/17  Long, [name], MD  sucralfate (CARAFATE) 1 g tablet Take 1 tablet (1 g total) by mouth 3 (three) times daily as needed. 11/26/17   Long, [name], MD    Allergies Patient has no known allergies.  History reviewed. No pertinent family history.  Social History Social History   Tobacco Use  . Smoking status: Current Every Day Smoker    Packs/day: 0.50    Types: Cigarettes  . Smokeless tobacco: Never Used  Substance Use Topics  . Alcohol use: No  . Drug use: No    Review of Systems Constitutional: No fever/chills Cardiovascular: Denies chest pain. Respiratory: Denies shortness of breath. Musculoskeletal: Positive left knee pain. Skin: Negative for rash. Neurological: Negative for  focal weakness or numbness. ___________________________________________   PHYSICAL EXAM:  VITAL SIGNS: ED Triage Vitals  Enc Vitals Group     BP 05/08/18 1338 126/86     Pulse Rate 05/08/18 1338 75     Resp 05/08/18 1338 16     Temp 05/08/18 1338 97.9 F (36.6 C)     Temp Source 05/08/18 1338 Oral     SpO2 05/08/18 1338 98 %     Weight 05/08/18 1339 170 lb (77.1 kg)     Height 05/08/18 1339 6' (1.829 m)     Head Circumference --      Peak Flow --      Pain Score  05/08/18 1339 8     Pain Loc --      Pain Edu? --      Excl. in GC? --    Constitutional: Alert and oriented. Well appearing and in no acute distress. Eyes: Conjunctivae are normal.  Head: Atraumatic. Neck: No stridor.   Cardiovascular: Normal rate, regular rhythm. Grossly normal heart sounds.  Good peripheral circulation. Respiratory: Normal respiratory effort.  No retractions. Lungs CTAB. Musculoskeletal: On examination of the left knee there is no gross deformity and no appreciated effusion.  Range of motion is without crepitus.  Range of motion is slow and guarded secondary to discomfort.  Ligaments are stable bilaterally.  Skin is intact.  Patient is able to bear weight. Neurologic:  Normal speech and language.  No gross focal neurologic deficits are appreciated. No gait instability. Skin:  Skin is warm, dry and intact. No rash noted. Psychiatric: Mood and affect are normal. Speech and behavior are normal.  ____________________________________________   LABS (all labs ordered are listed, but only abnormal results are displayed)  Labs Reviewed - No data to display  RADIOLOGY   Official radiology report(s): Dg Knee Complete 4 Views Left  Result Date: 05/08/2018 CLINICAL DATA:  Acute onset of LEFT knee pain 2 days ago after a misstep when stepping off of a pallet, now with generalized LEFT knee pain and inability to bear weight. Initial encounter. EXAM: LEFT KNEE - COMPLETE 4+ VIEW COMPARISON:  None. FINDINGS: No evidence of acute fracture or dislocation. Well corticated ossicle or fragment adjacent to the LATERAL tibial plateau at or near the insertion of the LATERAL collateral ligament indicating a remote injury. Well-preserved joint spaces. Well-preserved bone mineral density. No other intrinsic osseous abnormalities. Small joint effusion. IMPRESSION: 1. No acute osseous abnormality. 2. Remote avulsion injury versus dystrophic calcification related to a remote injury involving the LATERAL collateral ligament at or near its insertion on the LATERAL tibial plateau. 3. Small joint effusion. Electronically Signed   By: Hulan Saas M.D.   On: 05/08/2018 14:26    ____________________________________________   PROCEDURES  Procedure(s) performed (including Critical Care):  Procedures   ____________________________________________   INITIAL IMPRESSION / ASSESSMENT AND PLAN / ED COURSE  As part of my medical decision making, I reviewed the following data within the electronic MEDICAL RECORD NUMBER Notes from prior ED visits and [place] Controlled Substance Database  28 year old male presents to the ED with complaint of left knee pain after he stepped off a pallet several days ago with pain to his left  knee.  He also has history of a knee injury several years ago and is also been seen in [place] several times although he admits he has never followed up with an orthopedist.  He has a knee immobilizer in the car that he did not bring into the ED with him.  He was given Toradol 30 mg IM while waiting for the x-rays.  X-ray of the left knee shows a small effusion and what appears to be his old injury.  Patient was given a prescription for etodolac 400 mg twice daily and the name of the orthopedist that was on call for [facility] to follow-up with.  He is encouraged to get his knee immobilizer from his car and begin wearing it for support.  Patient requested a note for work to be absent.  ____________________________________________   FINAL CLINICAL IMPRESSION(S) / ED DIAGNOSES  Final diagnoses:  Chronic pain of left knee     ED Discharge Orders  Ordered    etodolac (LODINE) 400 MG tablet  2 times daily     05/08/18 1453           Note:  This document was prepared using Dragon voice recognition software and may include unintentional dictation errors.    Tommi RumpsSummers, Rhonda L, PA-C 05/08/18 1539    Jene EveryKinner, Robert, MD 05/16/18 [phone]

## 2018-05-08 NOTE — ED Triage Notes (Signed)
A&O, ambulatory. States few days ago stepped down and injured L knee. Hx of injury to L knee few years ago.

## 2018-05-08 NOTE — Discharge Instructions (Signed)
Call tomorrow make an appointment with the orthopedist listed on your discharge papers.  Begin using ice and elevation for your knee and wear your knee immobilizer/brace that you have in your car for added support and protection.  Begin taking etodolac 400 mg twice daily with food.

## 2018-06-13 ENCOUNTER — Other Ambulatory Visit: Payer: Self-pay

## 2018-06-13 ENCOUNTER — Encounter (HOSPITAL_BASED_OUTPATIENT_CLINIC_OR_DEPARTMENT_OTHER): Payer: Self-pay | Admitting: *Deleted

## 2018-06-13 ENCOUNTER — Emergency Department (HOSPITAL_BASED_OUTPATIENT_CLINIC_OR_DEPARTMENT_OTHER)
Admission: EM | Admit: 2018-06-13 | Discharge: 2018-06-13 | Disposition: A | Discharge status: Home / Self Care | Payer: Managed Care, Other (non HMO) | Attending: Emergency Medicine | Admitting: Emergency Medicine

## 2018-06-13 ENCOUNTER — Emergency Department (HOSPITAL_BASED_OUTPATIENT_CLINIC_OR_DEPARTMENT_OTHER): Payer: Managed Care, Other (non HMO)

## 2018-06-13 DIAGNOSIS — Y9389 Activity, other specified: Secondary | ICD-10-CM | POA: Insufficient documentation

## 2018-06-13 DIAGNOSIS — S99911A Unspecified injury of right ankle, initial encounter: Secondary | ICD-10-CM | POA: Diagnosis present

## 2018-06-13 DIAGNOSIS — F17210 Nicotine dependence, cigarettes, uncomplicated: Secondary | ICD-10-CM | POA: Diagnosis not present

## 2018-06-13 DIAGNOSIS — S93401A Sprain of unspecified ligament of right ankle, initial encounter: Secondary | ICD-10-CM | POA: Diagnosis not present

## 2018-06-13 DIAGNOSIS — X509XXA Other and unspecified overexertion or strenuous movements or postures, initial encounter: Secondary | ICD-10-CM | POA: Diagnosis not present

## 2018-06-13 DIAGNOSIS — Y929 Unspecified place or not applicable: Secondary | ICD-10-CM | POA: Diagnosis not present

## 2018-06-13 DIAGNOSIS — Y990 Civilian activity done for income or pay: Secondary | ICD-10-CM | POA: Insufficient documentation

## 2018-06-13 DIAGNOSIS — Z79899 Other long term (current) drug therapy: Secondary | ICD-10-CM | POA: Diagnosis not present

## 2018-06-13 NOTE — Discharge Instructions (Signed)
Please see the information and instructions below regarding your visit.  Your diagnoses today include:  1. Sprain of right ankle, unspecified ligament, initial encounter    Your provider has diagnosed you as suffering from an ankle sprain. Ankle sprain occurs when the ligaments that hold the ankle joint together are stretched or torn. It may take 4 to 6 weeks to heal.  Tests performed today include: An x-ray of your ankle - does NOT show any broken bones  See side panel of your discharge paperwork for testing performed today. Vital signs are listed at the bottom of these instructions.   Medications prescribed:  Take any prescribed medications only as prescribed, and any over the counter medications only as directed on the packaging.  You are prescribed ibuprofen, a non-steroidal anti-inflammatory agent (NSAID) for pain. You may take 600 mg every 6 hours as needed for pain. If still requiring this medication around the clock for acute pain after 10 days, please see your primary healthcare provider.  Women who are pregnant, breastfeeding, or planning on becoming pregnant should not take non-steroidal anti-inflammatories such as Advil and Aleve. Tylenol is a safe over the counter pain reliever in pregnant women.  You may combine this medication with Tylenol, 650 mg every 6 hours, so you are receiving something for pain every 3 hours.  This is not a long-term medication unless under the care and direction of your primary provider. Taking this medication long-term and not under the supervision of a healthcare provider could increase the risk of stomach ulcers, kidney problems, and cardiovascular problems such as high blood pressure.    Home care instructions:  Follow R.I.C.E. Protocol: R - rest your injury  I  - use ice on injury without applying directly to skin C - compress injury with bandage or splint E - elevate the injury as much as possible  For Activity: Wear ankle brace for at  least 2 weeks for stabilization of ankle. If prescribed crutches, use crutches with non-weight bearing for the first few days. Then, you may walk on your ankle as the pain allows, or as instructed. Start gradually with weight bearing on the affected ankle. Once you can walk pain free, then try jogging. When you can run forwards, then you can try moving side-to-side. If you cannot walk without crutches in one week, you need a re-check.  Please follow any educational materials contained in this packet.   Follow-up instructions: Please follow-up with your primary care provider or the provided orthopedic (bone specialist) listed in this packet if you continue to have significant pain or trouble walking in 1 week. In this case you may have a severe sprain that requires further care.   Return instructions:  Please return if your toes are numb or tingling, appear gray or blue, are much colder than your other foot, or you have severe pain (also elevate leg and loosen splint or wrap). Please return to the Emergency Department if you experience worsening symptoms.  Please return if you have any other emergent concerns.  Additional Information:   Your vital signs today were: BP 124/66 (BP Location: Right Arm)    Pulse 67    Temp 98.1 F (36.7 C) (Oral)    Resp 18    Ht 6' (1.829 m)    Wt 81.6 kg    SpO2 98%    BMI 24.41 kg/m  If your blood pressure (BP) was elevated on multiple readings during this visit above 130 for the top number or  above 80 for the bottom number, please have this repeated by your primary care provider within one month. --------------  Thank you for allowing [place] to participate in your care today.

## 2018-06-13 NOTE — ED Provider Notes (Signed)
[facility] EMERGENCY DEPARTMENT Provider Note   CSN: [id number] Arrival date & time: 06/13/18  1250    History   Chief Complaint Chief Complaint  Patient presents with  . Ankle Injury    HPI Jonathan Nash is a 28 y.o. male.     HPI  Patient is a 28 year old male with a history of chronic pain of left knee presenting for left ankle injury.  Patient reports that he had an inversion injury to his left ankle at work at approximately 11 PM.  He reports that a pallet he was standing on broke.  Patient ports he was able to ambulate on his ankle.  Denies loss of sensation in the left lower extremity or discoloration.  Patient reports he put ice on the ankle after the injury.  History reviewed. No pertinent past medical history.  Patient Active Problem List   Diagnosis Date Noted  . Chronic pain of left knee 01/07/2018    History reviewed. No pertinent surgical history.      Home Medications    Prior to Admission medications   Medication Sig Start Date End Date Taking? Authorizing Provider  acetaminophen (TYLENOL) 500 MG tablet Take 1 tablet (500 mg total) by mouth every 6 (six) hours as needed. 12/21/17   Fawze, [name], PA-C  dicyclomine (BENTYL) 20 MG tablet Take 1 tablet (20 mg total) by mouth 2 (two) times daily as needed for spasms. 11/26/17   Long, [name], MD  erythromycin ophthalmic ointment Place a 1/2 inch ribbon of ointment into the lower eyelid. 12/18/11   [name], MD  etodolac (LODINE) 400 MG tablet Take 1 tablet (400 mg total) by mouth 2 (two) times daily. 05/08/18   Tommi Rumps, PA-C  magic mouthwash w/lidocaine SOLN Take 5 mLs by mouth 4 (four) times daily as needed for mouth pain. 03/24/18   [name], PA-C  pantoprazole (PROTONIX) 40 MG tablet Take 1 tablet (40 mg total) by mouth daily. 11/26/17 12/26/17  Long, [name], MD  sucralfate (CARAFATE) 1 g tablet Take 1 tablet (1 g total) by mouth 3 (three) times daily as needed. 11/26/17    Long, [name], MD    Family History No family history on file.  Social History Social History   Tobacco Use  . Smoking status: Current Every Day Smoker    Packs/day: 0.50    Types: Cigarettes  . Smokeless tobacco: Never Used  Substance Use Topics  . Alcohol use: No  . Drug use: No     Allergies   Patient has no known allergies.   Review of Systems Review of Systems  Musculoskeletal: Positive for arthralgias.  Skin: Negative for color change and wound.  Neurological: Negative for weakness and numbness.     Physical Exam Updated Vital Signs BP 124/66 (BP Location: Right Arm)   Pulse 67   Temp 98.1 F (36.7 C) (Oral)   Resp 18   Ht 6' (1.829 m)   Wt 81.6 kg   SpO2 98%   BMI 24.41 kg/m   Physical Exam Vitals signs and nursing note reviewed.  Constitutional:      General: He is not in acute distress.    Appearance: He is well-developed. He is not diaphoretic.     Comments: Sitting comfortably in bed.  HENT:     Head: Normocephalic and atraumatic.  Eyes:     General:        Right eye: No discharge.  Left eye: No discharge.     Conjunctiva/sclera: Conjunctivae normal.     Comments: EOMs normal to gross examination.  Neck:     Musculoskeletal: Normal range of motion.  Cardiovascular:     Rate and Rhythm: Normal rate and regular rhythm.     Comments: Intact, 2+ DP pulse of LLE.  Abdominal:     General: There is no distension.  Musculoskeletal:        General: Tenderness present. No swelling or deformity.     Comments: Left ankle with tenderness to palpation of over anterior ankle. No TTP of medial or lateral malleolusNo swelling.  Full ROM. No erythema, ecchymosis, or deformity appreciated. No break in skin. No pain to fifth metatarsal area or navicular region. Achilles intact per Netherland's test. Good pedal pulse and cap refill of toes. Sensation intact to light touch distally.   Skin:    General: Skin is warm and dry.  Neurological:     Mental  Status: He is alert.     Comments: Cranial nerves intact to gross observation. Patient moves extremities without difficulty.  Psychiatric:        Behavior: Behavior normal.        Thought Content: Thought content normal.        Judgment: Judgment normal.      ED Treatments / Results  Labs (all labs ordered are listed, but only abnormal results are displayed) Labs Reviewed - No data to display  EKG None  Radiology Dg Ankle Complete Right  Result Date: 06/13/2018 CLINICAL DATA:  Recent inversion injury with ankle pain, initial encounter EXAM: RIGHT ANKLE - COMPLETE 3+ VIEW COMPARISON:  None. FINDINGS: There is no evidence of fracture, dislocation, or joint effusion. There is no evidence of arthropathy or other focal bone abnormality. Soft tissues are unremarkable. IMPRESSION: No acute abnormality noted. Electronically Signed   By: Alcide CleverMark  Lukens M.D.   On: 06/13/2018 13:26    Procedures Procedures (including critical care time)  Medications Ordered in ED Medications - No data to display   Initial Impression / Assessment and Plan / ED Course  I have reviewed the triage vital signs and the nursing notes.  Pertinent labs & imaging results that were available during my care of the patient were reviewed by me and considered in my medical decision making (see chart for details).  Clinical Course as of Jun 12 1940  Mon Jun 13, 2018  1343 Pt declined crutches.   [AM]    Clinical Course User Index [AM] Elisha PonderMurray, Antone Summons B, PA-C        Patient is well-appearing and neurovascularly intact in the left lower extremity.  Radiograph without evidence of bony abnormality, reviewed by me.  Suspect ankle sprain.  Patient placed in an ASO splint but declined crutches.  He is ambulatory in the left ankle.  Patient given instructions on weightbearing as tolerated, and RICE therapy.  Patient given sports medicine follow-up.  Return precautions given for any increasing pain, pallor or paresthesias  of the left lower extremity.  Patient is in understanding and agrees with the plan of care.  Final Clinical Impressions(s) / ED Diagnoses   Final diagnoses:  Sprain of right ankle, unspecified ligament, initial encounter    ED Discharge Orders    None       Delia ChimesMurray, Sarahmarie Leavey B, PA-C 06/13/18 1946    Benjiman CorePickering, Nathan, MD 06/14/18 [id number]

## 2018-06-13 NOTE — ED Triage Notes (Signed)
Right ankle injury. He was standing on a pallet at work and it broke. UDS will not be needed.

## 2018-07-14 ENCOUNTER — Encounter (HOSPITAL_BASED_OUTPATIENT_CLINIC_OR_DEPARTMENT_OTHER): Payer: Self-pay

## 2018-07-14 ENCOUNTER — Emergency Department (HOSPITAL_BASED_OUTPATIENT_CLINIC_OR_DEPARTMENT_OTHER)
Admission: EM | Admit: 2018-07-14 | Discharge: 2018-07-14 | Disposition: A | Discharge status: Home / Self Care | Payer: Managed Care, Other (non HMO) | Attending: Emergency Medicine | Admitting: Emergency Medicine

## 2018-07-14 ENCOUNTER — Other Ambulatory Visit: Payer: Self-pay

## 2018-07-14 DIAGNOSIS — Z79899 Other long term (current) drug therapy: Secondary | ICD-10-CM | POA: Insufficient documentation

## 2018-07-14 DIAGNOSIS — F17210 Nicotine dependence, cigarettes, uncomplicated: Secondary | ICD-10-CM | POA: Insufficient documentation

## 2018-07-14 DIAGNOSIS — K219 Gastro-esophageal reflux disease without esophagitis: Secondary | ICD-10-CM | POA: Insufficient documentation

## 2018-07-14 DIAGNOSIS — R1010 Upper abdominal pain, unspecified: Secondary | ICD-10-CM | POA: Diagnosis present

## 2018-07-14 HISTORY — DX: Gastro-esophageal reflux disease without esophagitis: K21.9

## 2018-07-14 MED ORDER — OMEPRAZOLE 40 MG PO CPDR: 40.0000 mg | DELAYED_RELEASE_CAPSULE | Freq: Every day | ORAL | 1 refills | Status: DC

## 2018-07-14 MED ORDER — PANTOPRAZOLE SODIUM 40 MG PO TBEC
40.0000 mg | DELAYED_RELEASE_TABLET | Freq: Once | ORAL | Status: AC
  Administered 2018-07-14: 40 mg via ORAL
  Filled 2018-07-14: qty 1

## 2018-07-14 MED ORDER — LIDOCAINE VISCOUS HCL 2 % MT SOLN
15.0000 mL | Freq: Once | OROMUCOSAL | Status: AC
  Administered 2018-07-14: 15 mL via ORAL
  Filled 2018-07-14: qty 15

## 2018-07-14 MED ORDER — ALUM & MAG HYDROXIDE-SIMETH 200-200-20 MG/5ML PO SUSP
30.0000 mL | Freq: Once | ORAL | Status: AC
  Administered 2018-07-14: 30 mL via ORAL
  Filled 2018-07-14: qty 30

## 2018-07-14 NOTE — ED Triage Notes (Signed)
Reports eating tacos for lunch today. Pain since 10. Hx of GERD. Takes Prilosec with no relief.

## 2018-07-14 NOTE — ED Provider Notes (Signed)
TIME SEEN: 2:33 AM  CHIEF COMPLAINT: Upper abdominal pain  HPI: Patient is a 28 year old male with history of GERD who presents to the emergency department with upper abdominal pain that he describes as sharp without radiation that started at 7 PM after eating tacos.  He has had similar symptoms once before after eating pizza and states his mother took him to the hospital in [place] where he was told this was acid reflux and put on Prilosec.  States he did not have any Prilosec at home to take tonight.  He did take Tums with some relief.  No chest pressure or chest discomfort, shortness of breath, fever, cough.  No nausea, vomiting or diarrhea.  No bloody stools or melena.  No previous history of abdominal surgery.  ROS: See HPI Constitutional: no fever  Eyes: no drainage  ENT: no runny nose   Cardiovascular:  no chest pain  Resp: no SOB  GI: no vomiting GU: no dysuria Integumentary: no rash  Allergy: no hives  Musculoskeletal: no leg swelling  Neurological: no slurred speech ROS otherwise negative  PAST MEDICAL HISTORY/PAST SURGICAL HISTORY:  Past Medical History:  Diagnosis Date  . GERD (gastroesophageal reflux disease)     MEDICATIONS:  Prior to Admission medications   Medication Sig Start Date End Date Taking? Authorizing Provider  acetaminophen (TYLENOL) 500 MG tablet Take 1 tablet (500 mg total) by mouth every 6 (six) hours as needed. 12/21/17   Fawze, [name], PA-C  dicyclomine (BENTYL) 20 MG tablet Take 1 tablet (20 mg total) by mouth 2 (two) times daily as needed for spasms. 11/26/17   Long, Arlyss RepressJoshua G, MD  erythromycin ophthalmic ointment Place a 1/2 inch ribbon of ointment into the lower eyelid. 12/18/11   Sunnie Nielsenpitz, Brian, MD  etodolac (LODINE) 400 MG tablet Take 1 tablet (400 mg total) by mouth 2 (two) times daily. 05/08/18   Tommi RumpsSummers, Rhonda L, PA-C  magic mouthwash w/lidocaine SOLN Take 5 mLs by mouth 4 (four) times daily as needed for mouth pain. 03/24/18   Antony MaduraHumes,  Kelly, PA-C  pantoprazole (PROTONIX) 40 MG tablet Take 1 tablet (40 mg total) by mouth daily. 11/26/17 12/26/17  Long, Arlyss RepressJoshua G, MD  sucralfate (CARAFATE) 1 g tablet Take 1 tablet (1 g total) by mouth 3 (three) times daily as needed. 11/26/17   Long, Arlyss RepressJoshua G, MD    ALLERGIES:  No Known Allergies  SOCIAL HISTORY:  Social History   Tobacco Use  . Smoking status: Current Every Day Smoker    Packs/day: 0.50    Types: Cigarettes  . Smokeless tobacco: Never Used  Substance Use Topics  . Alcohol use: No    FAMILY HISTORY: No family history on file.  EXAM: BP 121/79 (BP Location: Right Arm)   Pulse 79   Temp 98.2 F (36.8 C) (Oral)   Resp 14   Ht 6' (1.829 m)   Wt 77.6 kg   SpO2 98%   BMI 23.19 kg/m  CONSTITUTIONAL: Alert and oriented and responds appropriately to questions. Well-appearing; well-nourished HEAD: Normocephalic EYES: Conjunctivae clear, pupils appear equal, EOMI ENT: normal nose; moist mucous membranes NECK: Supple, no meningismus, no nuchal rigidity, no LAD  CARD: RRR; S1 and S2 appreciated; no murmurs, no clicks, no rubs, no gallops RESP: Normal chest excursion without splinting or tachypnea; breath sounds clear and equal bilaterally; no wheezes, no rhonchi, no rales, no hypoxia or respiratory distress, speaking full sentences ABD/GI: Normal bowel sounds; non-distended; soft, non-tender, no rebound, no guarding, no  peritoneal signs, no hepatosplenomegaly BACK:  The back appears normal and is non-tender to palpation, there is no CVA tenderness EXT: Normal ROM in all joints; non-tender to palpation; no edema; normal capillary refill; no cyanosis, no calf tenderness or swelling    SKIN: Normal color for age and race; warm; no rash NEURO: Moves all extremities equally PSYCH: The patient's mood and manner are appropriate. Grooming and personal hygiene are appropriate.  MEDICAL DECISION MAKING: Patient here with symptoms of GERD.  His abdominal exam is benign.  He is  well-appearing without fevers, vomiting or diarrhea.  Will treat symptoms with GI cocktail, Protonix.  I do not feel he needs labs or imaging.  His abdominal exam is benign.  He agrees with this plan.  He is requesting a work note as he states he had to leave work last night on the 17th because of the symptoms.  He states that he would like to return to work on the 19th.  Doubt cholecystitis, pancreatitis, appendicitis, bowel obstruction, perforation.  ED PROGRESS: 3:05 AM  Pt's pain has improved after GI cocktail and Protonix.  Will discharge with prescription of Prilosec.  Discussed supportive care instructions including over-the-counter medications he can use in the future for heartburn and recommended diet changes.  Provided with work note.   At this time, I do not feel there is any life-threatening condition present. I have reviewed and discussed all results (EKG, imaging, lab, urine as appropriate) and exam findings with patient/family. I have reviewed nursing notes and appropriate previous records.  I feel the patient is safe to be discharged home without further emergent workup and can continue workup as an outpatient as needed. Discussed usual and customary return precautions. Patient/family verbalize understanding and are comfortable with this plan.  Outpatient follow-up has been provided as needed. All questions have been answered.      Aldridge Krzyzanowski, Delice Bison, DO 07/14/18 [phone]

## 2018-07-14 NOTE — Discharge Instructions (Addendum)
Please avoid NSAIDs such as aspirin (Goody powders), ibuprofen (Motrin, Advil), naproxen (Aleve) as these may worsen your symptoms.  Tylenol 1000 mg every 6 hours is safe to take as long as you have no history of liver problems (heavy alcohol use, cirrhosis, hepatitis).  Please avoid spicy, acidic (citrus fruits, tomato based sauces, salsa), greasy, fatty foods.  Please avoid caffeine and alcohol.     You may use over-the-counter medication such as Tums, Maalox, Pepcid which can help with symptoms of heartburn.  These medications are safe to take with Prilosec.

## 2018-07-18 ENCOUNTER — Other Ambulatory Visit: Payer: Self-pay

## 2018-07-18 ENCOUNTER — Encounter (HOSPITAL_BASED_OUTPATIENT_CLINIC_OR_DEPARTMENT_OTHER): Payer: Self-pay | Admitting: *Deleted

## 2018-07-18 ENCOUNTER — Emergency Department (HOSPITAL_BASED_OUTPATIENT_CLINIC_OR_DEPARTMENT_OTHER)
Admission: EM | Admit: 2018-07-18 | Discharge: 2018-07-18 | Disposition: A | Discharge status: Home / Self Care | Payer: Managed Care, Other (non HMO) | Attending: Emergency Medicine | Admitting: Emergency Medicine

## 2018-07-18 DIAGNOSIS — R1013 Epigastric pain: Secondary | ICD-10-CM | POA: Diagnosis not present

## 2018-07-18 DIAGNOSIS — Z5321 Procedure and treatment not carried out due to patient leaving prior to being seen by health care provider: Secondary | ICD-10-CM | POA: Diagnosis not present

## 2018-07-18 MED ORDER — SUCRALFATE 1 G PO TABS: 1.0000 g | ORAL_TABLET | Freq: Three times a day (TID) | ORAL | 0 refills | Status: DC

## 2018-07-18 NOTE — ED Triage Notes (Signed)
Pt reports epigastric pain off and on x 2 years, seen here for same last week, started prilosec and cont with epigastric pain. States he has not had bm since last week also.

## 2018-07-24 NOTE — ED Provider Notes (Signed)
[facility] EMERGENCY DEPARTMENT Provider Note   CSN: [id number] Arrival date & time: 07/18/18  [id number]     History   Chief Complaint Chief Complaint  Patient presents with  . Abdominal Pain    HPI Jonathan Nash is a 28 y.o. male.     HPI   28 year old male with abdominal pain.  Upper abdomen/epigastric.  Intermittent for the past 2 years.  Worse in the past week or so.  Pain seems to be worse after eating foods.  More recently the pain was worsened after eating tacos and pizza.  Steak and PPIs and Tums previously which has presided some relief.  No fevers or chills.  No vomiting or diarrhea.  No acute urinary complaints.  Denies any past history of any abdominal surgery.  Past Medical History:  Diagnosis Date  . GERD (gastroesophageal reflux disease)     Patient Active Problem List   Diagnosis Date Noted  . Chronic pain of left knee 01/07/2018    History reviewed. No pertinent surgical history.      Home Medications    Prior to Admission medications   Medication Sig Start Date End Date Taking? Authorizing Provider  acetaminophen (TYLENOL) 500 MG tablet Take 1 tablet (500 mg total) by mouth every 6 (six) hours as needed. 12/21/17   Fawze, [name], PA-C  dicyclomine (BENTYL) 20 MG tablet Take 1 tablet (20 mg total) by mouth 2 (two) times daily as needed for spasms. 11/26/17   Long, Arlyss RepressJoshua G, MD  erythromycin ophthalmic ointment Place a 1/2 inch ribbon of ointment into the lower eyelid. 12/18/11   Sunnie Nielsenpitz, Brian, MD  etodolac (LODINE) 400 MG tablet Take 1 tablet (400 mg total) by mouth 2 (two) times daily. 05/08/18   Tommi RumpsSummers, Rhonda L, PA-C  magic mouthwash w/lidocaine SOLN Take 5 mLs by mouth 4 (four) times daily as needed for mouth pain. 03/24/18   Antony MaduraHumes, Kelly, PA-C  omeprazole (PRILOSEC) 40 MG capsule Take 1 capsule (40 mg total) by mouth daily. 07/14/18   Ward, Layla MawKristen N, DO  pantoprazole (PROTONIX) 40 MG tablet Take 1 tablet (40 mg total) by mouth daily.  11/26/17 12/26/17  Long, Arlyss RepressJoshua G, MD  sucralfate (CARAFATE) 1 g tablet Take 1 tablet (1 g total) by mouth 4 (four) times daily -  with meals and at bedtime. 07/18/18   Raeford RazorKohut, Jaedin Trumbo, MD    Family History History reviewed. No pertinent family history.  Social History Social History   Tobacco Use  . Smoking status: Current Every Day Smoker    Packs/day: 0.50    Types: Cigarettes  . Smokeless tobacco: Never Used  Substance Use Topics  . Alcohol use: No  . Drug use: No     Allergies   Patient has no known allergies.   Review of Systems Review of Systems  All systems reviewed and negative, other than as noted in HPI.  Physical Exam Updated Vital Signs BP 113/63 (BP Location: Right Arm)   Pulse 60   Temp 97.9 F (36.6 C) (Oral)   Resp 12   SpO2 100%   Physical Exam Vitals signs and nursing note reviewed.  Constitutional:      General: He is not in acute distress.    Appearance: He is well-developed.  HENT:     Head: Normocephalic and atraumatic.  Eyes:     General:        Right eye: No discharge.        Left eye: No discharge.  Conjunctiva/sclera: Conjunctivae normal.  Neck:     Musculoskeletal: Neck supple.  Cardiovascular:     Rate and Rhythm: Normal rate and regular rhythm.     Heart sounds: Normal heart sounds. No murmur. No friction rub. No gallop.   Pulmonary:     Effort: Pulmonary effort is normal. No respiratory distress.     Breath sounds: Normal breath sounds.  Abdominal:     General: There is no distension.     Palpations: Abdomen is soft.     Tenderness: There is abdominal tenderness.     Comments: Mild epigastric tenderness without rebound or guarding.  No distention.  Musculoskeletal:        General: No tenderness.  Skin:    General: Skin is warm and dry.  Neurological:     Mental Status: He is alert.  Psychiatric:        Behavior: Behavior normal.        Thought Content: Thought content normal.      ED Treatments / Results  Labs  (all labs ordered are listed, but only abnormal results are displayed) Labs Reviewed - No data to display  EKG    Radiology No results found.  Procedures Procedures (including critical care time)  Medications Ordered in ED Medications - No data to display   Initial Impression / Assessment and Plan / ED Course  I have reviewed the triage vital signs and the nursing notes.  Pertinent labs & imaging results that were available during my care of the patient were reviewed by me and considered in my medical decision making (see chart for details).  28 year old male with upper abdominal pain.  Acute on chronic.  I suspect gastritis or ulcer.  Advised to take Carafate in addition to regularly taking PPI.  Return precautions were discussed.  Outpatient follow-up otherwise.  Final Clinical Impressions(s) / ED Diagnoses   Final diagnoses:  Epigastric pain    ED Discharge Orders         Ordered    sucralfate (CARAFATE) 1 g tablet  3 times daily with meals & bedtime     07/18/18 1107           Virgel Manifold, MD 07/24/18 1150

## 2018-07-26 ENCOUNTER — Emergency Department (HOSPITAL_BASED_OUTPATIENT_CLINIC_OR_DEPARTMENT_OTHER)
Admission: EM | Admit: 2018-07-26 | Discharge: 2018-07-26 | Disposition: A | Discharge status: Home / Self Care | Payer: Managed Care, Other (non HMO) | Attending: Emergency Medicine | Admitting: Emergency Medicine

## 2018-07-26 ENCOUNTER — Other Ambulatory Visit: Payer: Self-pay

## 2018-07-26 ENCOUNTER — Encounter (HOSPITAL_BASED_OUTPATIENT_CLINIC_OR_DEPARTMENT_OTHER): Payer: Self-pay | Admitting: Emergency Medicine

## 2018-07-26 DIAGNOSIS — R1013 Epigastric pain: Secondary | ICD-10-CM | POA: Diagnosis not present

## 2018-07-26 DIAGNOSIS — F17210 Nicotine dependence, cigarettes, uncomplicated: Secondary | ICD-10-CM | POA: Insufficient documentation

## 2018-07-26 MED ORDER — LIDOCAINE VISCOUS HCL 2 % MT SOLN
15.0000 mL | Freq: Once | OROMUCOSAL | Status: AC
  Administered 2018-07-26: 15 mL via ORAL
  Filled 2018-07-26: qty 15

## 2018-07-26 MED ORDER — ALUM & MAG HYDROXIDE-SIMETH 200-200-20 MG/5ML PO SUSP
30.0000 mL | Freq: Once | ORAL | Status: AC
  Administered 2018-07-26: 03:00:00 30 mL via ORAL
  Filled 2018-07-26: qty 30

## 2018-07-26 NOTE — ED Triage Notes (Signed)
Pt reports epigastric pain that started tonight after drinking an energy drink at work. Denies n/v/d. Pt was seen last week here for abd pain.

## 2018-07-26 NOTE — ED Provider Notes (Signed)
[facility] EMERGENCY DEPARTMENT Provider Note   CSN: [id number] Arrival date & time: 07/26/18  0145     History   Chief Complaint Chief Complaint  Patient presents with  . Abdominal Pain    HPI Jonathan Nash is a 28 y.o. male.     HPI 28 year old male with history of GERD and family history of gastritis comes in a chief complaint of epigastric abdominal pain. He reports that he had chicken wrap followed by an energy drink, and suddenly started having severe epigastric pain.  The pain was excruciating and he decided to come to the ER.  Pain is improved subsequently.  He describes the pain as pressure type pain in the epigastric region that is nonradiating.  He has had similar pain every few months since 2017.  Unfortunately has not seen a GI doctor for it.  He denies any bloody stools, nausea, vomiting.  Past Medical History:  Diagnosis Date  . GERD (gastroesophageal reflux disease)     Patient Active Problem List   Diagnosis Date Noted  . Chronic pain of left knee 01/07/2018    History reviewed. No pertinent surgical history.      Home Medications    Prior to Admission medications   Medication Sig Start Date End Date Taking? Authorizing Provider  omeprazole (PRILOSEC) 40 MG capsule Take 1 capsule (40 mg total) by mouth daily. 07/14/18  Yes Ward, Baxter HireKristen N, DO  sucralfate (CARAFATE) 1 g tablet Take 1 tablet (1 g total) by mouth 4 (four) times daily -  with meals and at bedtime. 07/18/18  Yes Raeford RazorKohut, Stephen, MD    Family History No family history on file.  Social History Social History   Tobacco Use  . Smoking status: Current Every Day Smoker    Packs/day: 0.50    Types: Cigarettes  . Smokeless tobacco: Never Used  Substance Use Topics  . Alcohol use: No  . Drug use: No     Allergies   Patient has no known allergies.   Review of Systems Review of Systems  Constitutional: Positive for activity change.  Cardiovascular: Negative for  chest pain.  Gastrointestinal: Positive for abdominal pain. Negative for nausea and vomiting.  Allergic/Immunologic: Negative for immunocompromised state.     Physical Exam Updated Vital Signs BP 119/76 (BP Location: Right Arm)   Pulse 82   Temp 98 F (36.7 C) (Oral)   Resp 16   Ht 6' (1.829 m)   Wt 77.6 kg   SpO2 99%   BMI 23.19 kg/m   Physical Exam Vitals signs and nursing note reviewed.  Constitutional:      Appearance: He is well-developed.  HENT:     Head: Atraumatic.  Neck:     Musculoskeletal: Neck supple.  Cardiovascular:     Rate and Rhythm: Normal rate.  Pulmonary:     Effort: Pulmonary effort is normal.  Abdominal:     Tenderness: There is abdominal tenderness in the epigastric area. There is no guarding or rebound.  Skin:    General: Skin is warm.  Neurological:     Mental Status: He is alert and oriented to person, place, and time.      ED Treatments / Results  Labs (all labs ordered are listed, but only abnormal results are displayed) Labs Reviewed - No data to display  EKG    Radiology No results found.  Procedures Procedures (including critical care time)  Medications Ordered in ED Medications  alum & mag hydroxide-simeth (MAALOX/MYLANTA) 200-200-20  MG/5ML suspension 30 mL (30 mLs Oral Given 07/26/18 0312)    And  lidocaine (XYLOCAINE) 2 % viscous mouth solution 15 mL (15 mLs Oral Given 07/26/18 0313)     Initial Impression / Assessment and Plan / ED Course  I have reviewed the triage vital signs and the nursing notes.  Pertinent labs & imaging results that were available during my care of the patient were reviewed by me and considered in my medical decision making (see chart for details).        28 year old with history of GERD comes in with chief complaint of severe epigastric pain.  On exam he has no peritoneal findings and there is no right upper quadrant tenderness that is concerning for cholelithiasis.  It appears that he  gets periodic pain of this nature, after food intake.  Initial differential diagnosis includes gastritis, duodenitis, cholelithiasis, pancreatitis.  His pain is improved and there is no peritoneal findings -therefore we do not see any reason to get labs. No [place] available at this time.  I have reviewed lipase and CMP from previous visit which was negative.  For now it might be best for patient to follow-up with GI for further work-up.  Final Clinical Impressions(s) / ED Diagnoses   Final diagnoses:  Epigastric pain    ED Discharge Orders    None       Varney Biles, MD 07/26/18 [phone]

## 2018-09-04 ENCOUNTER — Emergency Department (HOSPITAL_BASED_OUTPATIENT_CLINIC_OR_DEPARTMENT_OTHER)
Admission: EM | Admit: 2018-09-04 | Discharge: 2018-09-04 | Disposition: A | Discharge status: Home / Self Care | Payer: Managed Care, Other (non HMO) | Attending: Emergency Medicine | Admitting: Emergency Medicine

## 2018-09-04 ENCOUNTER — Other Ambulatory Visit: Payer: Self-pay

## 2018-09-04 DIAGNOSIS — M545 Low back pain, unspecified: Secondary | ICD-10-CM

## 2018-09-04 DIAGNOSIS — X509XXA Other and unspecified overexertion or strenuous movements or postures, initial encounter: Secondary | ICD-10-CM | POA: Diagnosis not present

## 2018-09-04 DIAGNOSIS — Z79899 Other long term (current) drug therapy: Secondary | ICD-10-CM | POA: Diagnosis not present

## 2018-09-04 DIAGNOSIS — M5489 Other dorsalgia: Secondary | ICD-10-CM | POA: Diagnosis present

## 2018-09-04 DIAGNOSIS — F17210 Nicotine dependence, cigarettes, uncomplicated: Secondary | ICD-10-CM | POA: Diagnosis not present

## 2018-09-04 MED ORDER — METHOCARBAMOL 500 MG PO TABS: 500.0000 mg | ORAL_TABLET | Freq: Three times a day (TID) | ORAL | 0 refills | Status: DC | PRN

## 2018-09-04 MED ORDER — IBUPROFEN 800 MG PO TABS: 800.0000 mg | ORAL_TABLET | Freq: Three times a day (TID) | ORAL | 0 refills | Status: DC | PRN

## 2018-09-04 NOTE — ED Notes (Signed)
Pt given heat packs

## 2018-09-04 NOTE — Discharge Instructions (Signed)
You were seen in the emergency department for low back pain after an injury at work a few weeks ago.  This is likely muscular and will take some time to heal.  We are prescribing you some ibuprofen and a muscle relaxant.  Can also try some local ice or heat to the area which may help with the pain.  Gentle stretching is also advised.  Please follow-up with your doctor or if your company uses a Workmen's Comp. doctor follow-up with them.  Return if any concerns.

## 2018-09-04 NOTE — ED Triage Notes (Signed)
Pt reports back injury a few weeks ago after pallet fell on him- last night was heavy lifting at work and re injured it. Pt able to walk. Pt reports lower back pain.

## 2018-09-05 NOTE — ED Provider Notes (Signed)
[facility] EMERGENCY DEPARTMENT Provider Note   CSN: [id number] Arrival date & time: 09/04/18  1303     History   Chief Complaint Chief Complaint  Patient presents with  . Back Pain    HPI Jonathan Nash is a 28 y.o. male. He was injured at work few weeks ago. Now returned to work 2 days ago and increased pain again. 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 No fever, no neuro sx.      The history is provided by the patient.  Back Pain Location:  Lumbar spine Quality:  Aching Radiates to:  Does not radiate Pain severity:  Moderate Pain is:  Unable to specify Onset quality:  Gradual Timing:  Intermittent Progression:  Unchanged Chronicity:  New Context: occupational injury   Relieved by:  Bed rest Worsened by:  Movement Ineffective treatments:  None tried Associated symptoms: no abdominal pain, no bladder incontinence, no bowel incontinence, no leg pain, no numbness, no paresthesias, no perianal numbness, no tingling and no weakness   Risk factors: no hx of cancer     Past Medical History:  Diagnosis Date  . GERD (gastroesophageal reflux disease)     Patient Active Problem List   Diagnosis Date Noted  . Chronic pain of left knee 01/07/2018    No past surgical history on file.      Home Medications    Prior to Admission medications   Medication Sig Start Date End Date Taking? Authorizing Provider  ibuprofen (ADVIL) 800 MG tablet Take 1 tablet (800 mg total) by mouth every 8 (eight) hours as needed. 09/04/18   Terrilee FilesButler, Michael C, MD  methocarbamol (ROBAXIN) 500 MG tablet Take 1 tablet (500 mg total) by mouth every 8 (eight) hours as needed for muscle spasms. 09/04/18   Terrilee FilesButler, Michael C, MD  omeprazole (PRILOSEC) 40 MG capsule Take 1 capsule (40 mg total) by mouth daily. 07/14/18   Ward, Layla MawKristen N, DO  sucralfate (CARAFATE) 1 g tablet Take 1 tablet (1 g total) by mouth 4 (four) times daily -  with meals and at bedtime. 07/18/18   Raeford RazorKohut, Stephen,  MD    Family History No family history on file.  Social History Social History   Tobacco Use  . Smoking status: Current Every Day Smoker    Packs/day: 0.50    Types: Cigarettes  . Smokeless tobacco: Never Used  Substance Use Topics  . Alcohol use: No  . Drug use: No     Allergies   Patient has no known allergies.   Review of Systems Review of Systems  Gastrointestinal: Negative for abdominal pain and bowel incontinence.  Genitourinary: Negative for bladder incontinence.  Musculoskeletal: Positive for back pain. Negative for neck pain.  Neurological: Negative for tingling, weakness, numbness and paresthesias.     Physical Exam Updated Vital Signs BP 114/78   Pulse 78   Temp 98.4 F (36.9 C)   Resp 19   Ht 6' (1.829 m)   Wt 79.4 kg   SpO2 99%   BMI 23.73 kg/m   Physical Exam Vitals signs and nursing note reviewed.  Constitutional:      Appearance: He is well-developed.  HENT:     Head: Normocephalic and atraumatic.  Eyes:     Conjunctiva/sclera: Conjunctivae normal.  Neck:     Musculoskeletal: Neck supple.  Cardiovascular:     Rate and Rhythm: Normal rate and regular rhythm.  Pulmonary:     Effort: Pulmonary effort is normal.  Musculoskeletal: Normal  range of motion.        General: Tenderness present.     Comments: No midline tenderness, has tender left paralumbar tenderness. Nl LE strength, nl gait, nl sensation.   Skin:    General: Skin is warm and dry.  Neurological:     Mental Status: He is alert.     GCS: GCS eye subscore is 4. GCS verbal subscore is 5. GCS motor subscore is 6.      ED Treatments / Results  Labs (all labs ordered are listed, but only abnormal results are displayed) Labs Reviewed - No data to display  EKG None  Radiology No results found.  Procedures Procedures (including critical care time)  Medications Ordered in ED Medications - No data to display   Initial Impression / Assessment and Plan / ED Course  I  have reviewed the triage vital signs and the nursing notes.  Pertinent labs & imaging results that were available during my care of the patient were reviewed by me and considered in my medical decision making (see chart for details).      mechanical back pain, no red flags. Symptomatic treatment and return to work light duty.   Final Clinical Impressions(s) / ED Diagnoses   Final diagnoses:  Acute bilateral low back pain without sciatica    ED Discharge Orders         Ordered    ibuprofen (ADVIL) 800 MG tablet  Every 8 hours PRN     09/04/18 1538    methocarbamol (ROBAXIN) 500 MG tablet  Every 8 hours PRN     09/04/18 1538           Hayden Rasmussen, MD 09/05/18 1537

## 2018-09-06 ENCOUNTER — Encounter (HOSPITAL_BASED_OUTPATIENT_CLINIC_OR_DEPARTMENT_OTHER): Payer: Self-pay | Admitting: Emergency Medicine

## 2018-09-06 ENCOUNTER — Other Ambulatory Visit: Payer: Self-pay

## 2018-09-06 ENCOUNTER — Emergency Department (HOSPITAL_BASED_OUTPATIENT_CLINIC_OR_DEPARTMENT_OTHER)
Admission: EM | Admit: 2018-09-06 | Discharge: 2018-09-07 | Disposition: A | Discharge status: Home / Self Care | Payer: Managed Care, Other (non HMO) | Attending: Emergency Medicine | Admitting: Emergency Medicine

## 2018-09-06 DIAGNOSIS — F17210 Nicotine dependence, cigarettes, uncomplicated: Secondary | ICD-10-CM | POA: Insufficient documentation

## 2018-09-06 DIAGNOSIS — Z008 Encounter for other general examination: Secondary | ICD-10-CM

## 2018-09-06 DIAGNOSIS — M545 Low back pain: Secondary | ICD-10-CM | POA: Insufficient documentation

## 2018-09-06 NOTE — ED Triage Notes (Addendum)
Seen earlier this month for back injury and needs note for work clearance.

## 2018-09-07 NOTE — ED Provider Notes (Signed)
TIME SEEN: 12:57 AM  CHIEF COMPLAINT: Needs clearance to return to work  HPI: Patient is a 28 year old male with history of GERD who presents to the emergency department requesting a work note so that he can go back to work.  Patient was seen in the emergency department on 09/04/2018 with complaints of several weeks of back pain.  States he has a very physically active job.  Was given ibuprofen and Robaxin states symptoms have significantly improved.  He states that he would like to go back to full duty.  He was provided with a note recommending light duty at this time.  He denies numbness, tingling, weakness, bowel or bladder incontinence, fever.  ROS: See HPI Constitutional: no fever  Eyes: no drainage  ENT: no runny nose   Cardiovascular:  no chest pain  Resp: no SOB  GI: no vomiting GU: no dysuria Integumentary: no rash  Allergy: no hives  Musculoskeletal: no leg swelling  Neurological: no slurred speech ROS otherwise negative  PAST MEDICAL HISTORY/PAST SURGICAL HISTORY:  Past Medical History:  Diagnosis Date  . GERD (gastroesophageal reflux disease)     MEDICATIONS:  Prior to Admission medications   Medication Sig Start Date End Date Taking? Authorizing Provider  ibuprofen (ADVIL) 800 MG tablet Take 1 tablet (800 mg total) by mouth every 8 (eight) hours as needed. 09/04/18   Terrilee FilesButler, Michael C, MD  methocarbamol (ROBAXIN) 500 MG tablet Take 1 tablet (500 mg total) by mouth every 8 (eight) hours as needed for muscle spasms. 09/04/18   Terrilee FilesButler, Michael C, MD  omeprazole (PRILOSEC) 40 MG capsule Take 1 capsule (40 mg total) by mouth daily. 07/14/18   Jo-Anne Kluth, Layla MawKristen N, DO  sucralfate (CARAFATE) 1 g tablet Take 1 tablet (1 g total) by mouth 4 (four) times daily -  with meals and at bedtime. 07/18/18   Raeford RazorKohut, Stephen, MD    ALLERGIES:  No Known Allergies  SOCIAL HISTORY:  Social History   Tobacco Use  . Smoking status: Current Every Day Smoker    Packs/day: 0.50    Types: Cigarettes   . Smokeless tobacco: Never Used  Substance Use Topics  . Alcohol use: No    FAMILY HISTORY: History reviewed. No pertinent family history.  EXAM: BP 132/70 (BP Location: Right Arm)   Pulse 66   Temp 98 F (36.7 C)   Resp 16   Ht 6' (1.829 m)   Wt 79.4 kg   SpO2 100%   BMI 23.73 kg/m  CONSTITUTIONAL: Alert and oriented and responds appropriately to questions. Well-appearing; well-nourished HEAD: Normocephalic EYES: Conjunctivae clear, pupils appear equal, EOMI ENT: normal nose; moist mucous membranes NECK: Supple, no meningismus, no nuchal rigidity, no LAD  CARD: RRR; S1 and S2 appreciated; no murmurs, no clicks, no rubs, no gallops RESP: Normal chest excursion without splinting or tachypnea; breath sounds clear and equal bilaterally; no wheezes, no rhonchi, no rales, no hypoxia or respiratory distress, speaking full sentences ABD/GI: Normal bowel sounds; non-distended; soft, non-tender, no rebound, no guarding, no peritoneal signs, no hepatosplenomegaly BACK:  The back appears normal and is non-tender to palpation, there is no CVA tenderness, no midline spinal tenderness or step-off or deformity EXT: Normal ROM in all joints; non-tender to palpation; no edema; normal capillary refill; no cyanosis, no calf tenderness or swelling    SKIN: Normal color for age and race; warm; no rash NEURO: Moves all extremities equally, normal sensation diffusely, normal gait PSYCH: The patient's mood and manner are appropriate. Grooming and  personal hygiene are appropriate.  MEDICAL DECISION MAKING: Patient here requesting note to go back to full duty.  He has no significant back pain at this time and is neurologically intact.  Will provide with work note that he can go back to work today.  Discussed return precautions.  At this time, I do not feel there is any life-threatening condition present. I have reviewed and discussed all results (EKG, imaging, lab, urine as appropriate) and exam findings  with patient/family. I have reviewed nursing notes and appropriate previous records.  I feel the patient is safe to be discharged home without further emergent workup and can continue workup as an outpatient as needed. Discussed usual and customary return precautions. Patient/family verbalize understanding and are comfortable with this plan.  Outpatient follow-up has been provided as needed. All questions have been answered.      Berneda Piccininni, Delice Bison, DO 09/07/18 [phone]

## 2019-01-10 ENCOUNTER — Encounter (HOSPITAL_BASED_OUTPATIENT_CLINIC_OR_DEPARTMENT_OTHER): Payer: Self-pay | Admitting: Emergency Medicine

## 2019-01-10 ENCOUNTER — Emergency Department (HOSPITAL_BASED_OUTPATIENT_CLINIC_OR_DEPARTMENT_OTHER)
Admission: EM | Admit: 2019-01-10 | Discharge: 2019-01-10 | Disposition: A | Discharge status: Home / Self Care | Payer: Managed Care, Other (non HMO) | Attending: Emergency Medicine | Admitting: Emergency Medicine

## 2019-01-10 ENCOUNTER — Other Ambulatory Visit: Payer: Self-pay

## 2019-01-10 ENCOUNTER — Emergency Department (HOSPITAL_BASED_OUTPATIENT_CLINIC_OR_DEPARTMENT_OTHER): Payer: Managed Care, Other (non HMO)

## 2019-01-10 DIAGNOSIS — X509XXA Other and unspecified overexertion or strenuous movements or postures, initial encounter: Secondary | ICD-10-CM | POA: Insufficient documentation

## 2019-01-10 DIAGNOSIS — Y999 Unspecified external cause status: Secondary | ICD-10-CM | POA: Insufficient documentation

## 2019-01-10 DIAGNOSIS — S838X2A Sprain of other specified parts of left knee, initial encounter: Secondary | ICD-10-CM | POA: Insufficient documentation

## 2019-01-10 DIAGNOSIS — Y929 Unspecified place or not applicable: Secondary | ICD-10-CM | POA: Insufficient documentation

## 2019-01-10 DIAGNOSIS — F17210 Nicotine dependence, cigarettes, uncomplicated: Secondary | ICD-10-CM | POA: Insufficient documentation

## 2019-01-10 DIAGNOSIS — Y939 Activity, unspecified: Secondary | ICD-10-CM | POA: Insufficient documentation

## 2019-01-10 NOTE — ED Triage Notes (Signed)
L knee pain and swelling x 4 days. States he was walking and felt a pop.

## 2019-01-10 NOTE — Discharge Instructions (Signed)
Return if any problems.  See the Orthopaedist for evaluation  °

## 2019-01-10 NOTE — ED Provider Notes (Signed)
[facility] EMERGENCY DEPARTMENT Provider Note   CSN: [id number] Arrival date & time: 01/10/19  1815     History Chief Complaint  Patient presents with  . Knee Pain    Jonathan Nash is a 28 y.o. male.  The history is provided by the patient. No language interpreter was used.  Knee Pain Location:  Knee Time since incident:  4 days Knee location:  L knee Pain details:    Quality:  Aching   Severity:  Moderate   Duration:  4 days   Timing:  Constant Prior injury to area:  Yes Pt reports he was walking 4 days ago and had a pop in his knee.  Pt complains of swelling. Pt reports he had a cartilage injury in the past.      Past Medical History:  Diagnosis Date  . GERD (gastroesophageal reflux disease)     Patient Active Problem List   Diagnosis Date Noted  . Chronic pain of left knee 01/07/2018    History reviewed. No pertinent surgical history.     No family history on file.  Social History   Tobacco Use  . Smoking status: Current Every Day Smoker    Packs/day: 0.50    Types: Cigarettes  . Smokeless tobacco: Never Used  Substance Use Topics  . Alcohol use: No  . Drug use: No    Home Medications Prior to Admission medications   Medication Sig Start Date End Date Taking? Authorizing Provider  ibuprofen (ADVIL) 800 MG tablet Take 1 tablet (800 mg total) by mouth every 8 (eight) hours as needed. 09/04/18   Hayden Rasmussen, MD  methocarbamol (ROBAXIN) 500 MG tablet Take 1 tablet (500 mg total) by mouth every 8 (eight) hours as needed for muscle spasms. 09/04/18   Hayden Rasmussen, MD  omeprazole (PRILOSEC) 40 MG capsule Take 1 capsule (40 mg total) by mouth daily. 07/14/18   Ward, Delice Bison, DO  sucralfate (CARAFATE) 1 g tablet Take 1 tablet (1 g total) by mouth 4 (four) times daily -  with meals and at bedtime. 07/18/18   Virgel Manifold, MD    Allergies    Patient has no known allergies.  Review of Systems   Review of Systems  All other  systems reviewed and are negative.   Physical Exam Updated Vital Signs BP 118/80 (BP Location: Left Arm)   Pulse 76   Temp 98.9 F (37.2 C) (Oral)   Resp 18   Ht 6' (1.829 m)   Wt 76.2 kg   SpO2 98%   BMI 22.78 kg/m   Physical Exam Vitals reviewed.  Cardiovascular:     Rate and Rhythm: Normal rate.  Pulmonary:     Effort: Pulmonary effort is normal.  Musculoskeletal:        General: Swelling and tenderness present.     Comments: Swollen tender left knee,  Large effusion,  From  nv and ns intact   Skin:    General: Skin is warm.  Neurological:     Mental Status: He is alert.  Psychiatric:        Mood and Affect: Mood normal.     ED Results / Procedures / Treatments   Labs (all labs ordered are listed, but only abnormal results are displayed) Labs Reviewed - No data to display  EKG None  Radiology DG Knee Complete 4 Views Left  Result Date: 01/10/2019 CLINICAL DATA:  Left knee pain.  Felt pop.  Swelling. EXAM: LEFT KNEE -  COMPLETE 4+ VIEW COMPARISON:  None. FINDINGS: Large left knee joint effusion. No acute bony abnormality. Specifically, no fracture, subluxation, or dislocation. IMPRESSION: No acute bony abnormality.  Large joint effusion. Electronically Signed   By: Charlett Nose M.D.   On: 01/10/2019 19:20    Procedures Procedures (including critical care time)  Medications Ordered in ED Medications - No data to display  ED Course  I have reviewed the triage vital signs and the nursing notes.  Pertinent labs & imaging results that were available during my care of the patient were reviewed by me and considered in my medical decision making (see chart for details).    MDM Rules/Calculators/A&P                      MDM  Pt xray reviewed and pt advised of results.  Pt placed in a knee immbolizer.  He is advised to follow up with Orthopaedist for evaluation.  ICe, rest, Ibuprofen for discomfort Final Clinical Impression(s) / ED Diagnoses Final diagnoses:   Sprain of other ligament of left knee, initial encounter    Rx / DC Orders ED Discharge Orders    None     An After Visit Summary was printed and given to the patient.    Osie Cheeks 01/10/19 2146    Vanetta Mulders, MD 01/14/19 [phone]

## 2019-04-20 ENCOUNTER — Encounter (HOSPITAL_BASED_OUTPATIENT_CLINIC_OR_DEPARTMENT_OTHER): Payer: Self-pay | Admitting: *Deleted

## 2019-04-20 ENCOUNTER — Emergency Department (HOSPITAL_BASED_OUTPATIENT_CLINIC_OR_DEPARTMENT_OTHER)
Admission: EM | Admit: 2019-04-20 | Discharge: 2019-04-20 | Disposition: A | Discharge status: Home / Self Care | Payer: Self-pay | Attending: Emergency Medicine | Admitting: Emergency Medicine

## 2019-04-20 ENCOUNTER — Other Ambulatory Visit: Payer: Self-pay

## 2019-04-20 DIAGNOSIS — F17210 Nicotine dependence, cigarettes, uncomplicated: Secondary | ICD-10-CM | POA: Insufficient documentation

## 2019-04-20 DIAGNOSIS — G4700 Insomnia, unspecified: Secondary | ICD-10-CM | POA: Insufficient documentation

## 2019-04-20 DIAGNOSIS — R5383 Other fatigue: Secondary | ICD-10-CM | POA: Insufficient documentation

## 2019-04-20 LAB — CBC WITH DIFFERENTIAL/PLATELET
Abs Immature Granulocytes: 0.01 10*3/uL (ref 0.00–0.07)
Basophils Absolute: 0.1 10*3/uL (ref 0.0–0.1)
Basophils Relative: 1 %
Eosinophils Absolute: 0.2 10*3/uL (ref 0.0–0.5)
Eosinophils Relative: 3 %
HCT: 44 % (ref 39.0–52.0)
Hemoglobin: 15.5 g/dL (ref 13.0–17.0)
Immature Granulocytes: 0 %
Lymphocytes Relative: 43 %
Lymphs Abs: 2.3 10*3/uL (ref 0.7–4.0)
MCH: 34 pg (ref 26.0–34.0)
MCHC: 35.2 g/dL (ref 30.0–36.0)
MCV: 96.5 fL (ref 80.0–100.0)
Monocytes Absolute: 0.7 10*3/uL (ref 0.1–1.0)
Monocytes Relative: 12 %
Neutro Abs: 2.2 10*3/uL (ref 1.7–7.7)
Neutrophils Relative %: 41 %
Platelets: 289 10*3/uL (ref 150–400)
RBC: 4.56 MIL/uL (ref 4.22–5.81)
RDW: 12.4 % (ref 11.5–15.5)
WBC: 5.5 10*3/uL (ref 4.0–10.5)
nRBC: 0 % (ref 0.0–0.2)

## 2019-04-20 LAB — BASIC METABOLIC PANEL
Anion gap: 9 (ref 5–15)
BUN: 17 mg/dL (ref 6–20)
CO2: 23 mmol/L (ref 22–32)
Calcium: 9 mg/dL (ref 8.9–10.3)
Chloride: 105 mmol/L (ref 98–111)
Creatinine, Ser: 1.28 mg/dL — ABNORMAL HIGH (ref 0.61–1.24)
GFR calc Af Amer: >60 mL/min (ref >60)
GFR calc non Af Amer: >60 mL/min (ref >60)
Glucose, Bld: 100 mg/dL — ABNORMAL HIGH (ref 70–99)
Potassium: 4 mmol/L (ref 3.5–5.1)
Sodium: 137 mmol/L (ref 135–145)

## 2019-04-20 NOTE — Discharge Instructions (Signed)
You have been seen today for difficulty sleeping. Please read and follow all provided instructions. Return to the emergency room for worsening condition or new concerning symptoms.    1. Medications:  You can try home remedies to help with sleeping such as sleepy time tea or melatonin.  Continue usual home medications Take medications as prescribed. Please review all of the medicines and only take them if you do not have an allergy to them.   2. Treatment: rest, drink plenty of fluids Try to get on a regular sleep schedule. Recommend going to bed at the same time every night and waking up at the same time every morning.  3. Follow Up:  Please follow up with primary care provider by scheduling an appointment as soon as possible for a visit  If you do not have a primary care physician, contact [facility] at [phone] for referral   It is also a possibility that you have an allergic reaction to any of the medicines that you have been prescribed - Everybody reacts differently to medications and while MOST people have no trouble with most medicines, you may have a reaction such as nausea, vomiting, rash, swelling, shortness of breath. If this is the case, please stop taking the medicine immediately and contact your physician.  ?

## 2019-04-20 NOTE — ED Triage Notes (Signed)
Patient stated that he is falling asleep frequently for a month and wants to be check for sleep apnea.  No PCP.

## 2019-04-20 NOTE — ED Provider Notes (Signed)
[facility] EMERGENCY DEPARTMENT Provider Note   CSN: [id number] Arrival date & time: 04/20/19  1900     History Chief Complaint  Patient presents with  . Sleeping frequently    Jonathan Nash is a 29 y.o. male past medical history significant for GERD presents to emergency department today with chief complaint of difficulty sleeping x1 month.  Patient states at night he has trouble falling asleep which causes him to have a hard time staying awake during the day.  He states while he was sitting at his desk at work if he is not actively participating something he will doze off.  He is also endorsing generalized fatigue.  He has not tried any medications for symptoms prior to arrival.  He is requesting a study for sleep apnea.  Patient denies any drug or alcohol use. Denies any weight loss, rash, fever, chills, cough, chest pain, shortness of breath, bowel pain, nausea, vomiting, urinary symptoms, diarrhea.    History provided by patient with additional history obtained from chart review.     Past Medical History:  Diagnosis Date  . GERD (gastroesophageal reflux disease)     Patient Active Problem List   Diagnosis Date Noted  . Chronic pain of left knee 01/07/2018    History reviewed. No pertinent surgical history.     No family history on file.  Social History   Tobacco Use  . Smoking status: Current Every Day Smoker    Packs/day: 0.50    Types: Cigarettes  . Smokeless tobacco: Never Used  Substance Use Topics  . Alcohol use: Yes    Comment: occasionally  . Drug use: No    Home Medications Prior to Admission medications   Not on File    Allergies    Patient has no known allergies.  Review of Systems   Review of Systems  All other systems are reviewed and are negative for acute change except as noted in the HPI.   Physical Exam Updated Vital Signs BP 118/70 (BP Location: Right Arm)   Pulse 73   Temp 98.2 F (36.8 C) (Oral)   Resp 16    SpO2 96%   Physical Exam Vitals and nursing note reviewed.  Constitutional:      Appearance: He is well-developed. He is not ill-appearing or toxic-appearing.  HENT:     Head: Normocephalic and atraumatic.     Nose: Nose normal.  Eyes:     General: No scleral icterus.       Right eye: No discharge.        Left eye: No discharge.     Conjunctiva/sclera: Conjunctivae normal.  Neck:     Thyroid: No thyroid mass, thyromegaly or thyroid tenderness.     Vascular: No JVD.  Cardiovascular:     Rate and Rhythm: Normal rate and regular rhythm.     Pulses: Normal pulses.     Heart sounds: Normal heart sounds.  Pulmonary:     Effort: Pulmonary effort is normal.     Breath sounds: Normal breath sounds.  Abdominal:     General: There is no distension.  Musculoskeletal:        General: Normal range of motion.     Cervical back: Normal range of motion.  Skin:    General: Skin is warm and dry.     Capillary Refill: Capillary refill takes less than 2 seconds.  Neurological:     Mental Status: He is oriented to person, place, and time.  GCS: GCS eye subscore is 4. GCS verbal subscore is 5. GCS motor subscore is 6.     Comments: Fluent speech, no facial droop.  Speech is clear and goal oriented, follows commands CN III-XII intact, no facial droop Normal strength in upper and lower extremities bilaterally including dorsiflexion and plantar flexion, strong and equal grip strength Sensation normal to light and sharp touch Moves extremities without ataxia, coordination intact Normal finger to nose and rapid alternating movements Normal gait and balance   Psychiatric:        Behavior: Behavior normal.       ED Results / Procedures / Treatments   Labs (all labs ordered are listed, but only abnormal results are displayed) Labs Reviewed  BASIC METABOLIC PANEL - Abnormal; Notable for the following components:      Result Value   Glucose, Bld 100 (*)    Creatinine, Ser 1.28 (*)    All  other components within normal limits  CBC WITH DIFFERENTIAL/PLATELET    EKG None  Radiology No results found.  Procedures Procedures (including critical care time)  Medications Ordered in ED Medications - No data to display  ED Course  I have reviewed the triage vital signs and the nursing notes.  Pertinent labs & imaging results that were available during my care of the patient were reviewed by me and considered in my medical decision making (see chart for details).    MDM Rules/Calculators/A&P                      Patient seen and examined. Patient presents awake, alert, hemodynamically stable, afebrile, non toxic.  His chief complaint is difficulty sleeping with generalized fatigue.  Patient has no other constitutional symptoms.  Exam is unremarkable.   Labs show no leukocytosis, no anemia, no severe electrolyte derangement. Creatinine is mildly elevated at 1.28, compared to x1 year ago when it was 1.24. Encourage patient to increase PO fluids. With reassuring labs and exam patient is stable to be discharged home with symptomatic treatment. Discussed good sleep hygiene with patient.   The patient appears reasonably screened and/or stabilized for discharge and I doubt any other medical condition or other [facility] requiring further screening, evaluation, or treatment in the ED at this time prior to discharge. The patient is safe for discharge with strict return precautions discussed. Recommend pcp follow up for further evaluation.   Portions of this note were generated with [employer]. Dictation errors may occur despite best attempts at proofreading.   Final Clinical Impression(s) / ED Diagnoses Final diagnoses:  Insomnia, unspecified type    Rx / DC Orders ED Discharge Orders    None       Cherre Robins, PA-C 04/20/19 2036    Lennice Sites, DO 04/20/19 2328

## 2019-07-17 ENCOUNTER — Emergency Department (HOSPITAL_BASED_OUTPATIENT_CLINIC_OR_DEPARTMENT_OTHER)
Admission: EM | Admit: 2019-07-17 | Discharge: 2019-07-17 | Disposition: A | Discharge status: Home / Self Care | Payer: BC Managed Care – PPO | Attending: Emergency Medicine | Admitting: Emergency Medicine

## 2019-07-17 ENCOUNTER — Encounter (HOSPITAL_BASED_OUTPATIENT_CLINIC_OR_DEPARTMENT_OTHER): Payer: Self-pay | Admitting: Emergency Medicine

## 2019-07-17 ENCOUNTER — Other Ambulatory Visit: Payer: Self-pay

## 2019-07-17 DIAGNOSIS — L299 Pruritus, unspecified: Secondary | ICD-10-CM | POA: Diagnosis not present

## 2019-07-17 DIAGNOSIS — F17210 Nicotine dependence, cigarettes, uncomplicated: Secondary | ICD-10-CM | POA: Insufficient documentation

## 2019-07-17 DIAGNOSIS — M791 Myalgia, unspecified site: Secondary | ICD-10-CM | POA: Diagnosis not present

## 2019-07-17 MED ORDER — HYDROXYZINE HCL 25 MG PO TABS: 25.0000 mg | ORAL_TABLET | Freq: Four times a day (QID) | ORAL | 0 refills | Status: DC

## 2019-07-17 MED ORDER — DIPHENHYDRAMINE HCL 25 MG PO CAPS
25.0000 mg | ORAL_CAPSULE | Freq: Once | ORAL | Status: AC
  Administered 2019-07-17: 25 mg via ORAL
  Filled 2019-07-17: qty 1
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
## 2019-07-17 NOTE — ED Triage Notes (Addendum)
Pt reports itching since this morning. Reports covid vaccine (second dose) on Wednesday. States body aches and cough
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
## 2019-12-17 ENCOUNTER — Emergency Department (HOSPITAL_BASED_OUTPATIENT_CLINIC_OR_DEPARTMENT_OTHER)
Admission: EM | Admit: 2019-12-17 | Discharge: 2019-12-17 | Disposition: A | Discharge status: Home / Self Care | Payer: BC Managed Care – PPO | Attending: Emergency Medicine | Admitting: Emergency Medicine

## 2019-12-17 ENCOUNTER — Encounter (HOSPITAL_BASED_OUTPATIENT_CLINIC_OR_DEPARTMENT_OTHER): Payer: Self-pay | Admitting: Emergency Medicine

## 2019-12-17 ENCOUNTER — Other Ambulatory Visit: Payer: Self-pay

## 2019-12-17 DIAGNOSIS — R1013 Epigastric pain: Secondary | ICD-10-CM | POA: Insufficient documentation

## 2019-12-17 DIAGNOSIS — F17210 Nicotine dependence, cigarettes, uncomplicated: Secondary | ICD-10-CM | POA: Insufficient documentation

## 2019-12-17 DIAGNOSIS — K219 Gastro-esophageal reflux disease without esophagitis: Secondary | ICD-10-CM | POA: Insufficient documentation

## 2019-12-17 LAB — COMPREHENSIVE METABOLIC PANEL
ALT: 11 U/L (ref 0–44)
AST: 17 U/L (ref 15–41)
Albumin: 4.1 g/dL (ref 3.5–5.0)
Alkaline Phosphatase: 57 U/L (ref 38–126)
Anion gap: 9 (ref 5–15)
BUN: 12 mg/dL (ref 6–20)
CO2: 23 mmol/L (ref 22–32)
Calcium: 8.8 mg/dL — ABNORMAL LOW (ref 8.9–10.3)
Chloride: 105 mmol/L (ref 98–111)
Creatinine, Ser: 1.18 mg/dL (ref 0.61–1.24)
GFR, Estimated: >60 mL/min (ref >60)
Glucose, Bld: 107 mg/dL — ABNORMAL HIGH (ref 70–99)
Potassium: 4 mmol/L (ref 3.5–5.1)
Sodium: 137 mmol/L (ref 135–145)
Total Bilirubin: 0.7 mg/dL (ref 0.3–1.2)
Total Protein: 7 g/dL (ref 6.5–8.1)

## 2019-12-17 LAB — CBC WITH DIFFERENTIAL/PLATELET
Abs Immature Granulocytes: 0.01 10*3/uL (ref 0.00–0.07)
Basophils Absolute: 0.1 10*3/uL (ref 0.0–0.1)
Basophils Relative: 1 %
Eosinophils Absolute: 0.1 10*3/uL (ref 0.0–0.5)
Eosinophils Relative: 2 %
HCT: 43.9 % (ref 39.0–52.0)
Hemoglobin: 15.6 g/dL (ref 13.0–17.0)
Immature Granulocytes: 0 %
Lymphocytes Relative: 32 %
Lymphs Abs: 1.6 10*3/uL (ref 0.7–4.0)
MCH: 34.1 pg — ABNORMAL HIGH (ref 26.0–34.0)
MCHC: 35.5 g/dL (ref 30.0–36.0)
MCV: 96.1 fL (ref 80.0–100.0)
Monocytes Absolute: 0.6 10*3/uL (ref 0.1–1.0)
Monocytes Relative: 11 %
Neutro Abs: 2.7 10*3/uL (ref 1.7–7.7)
Neutrophils Relative %: 54 %
Platelets: 283 10*3/uL (ref 150–400)
RBC: 4.57 MIL/uL (ref 4.22–5.81)
RDW: 12.4 % (ref 11.5–15.5)
WBC: 5 10*3/uL (ref 4.0–10.5)
nRBC: 0 % (ref 0.0–0.2)

## 2019-12-17 LAB — LIPASE, BLOOD: Lipase: 43 U/L (ref 11–51)

## 2019-12-17 MED ORDER — PANTOPRAZOLE SODIUM 20 MG PO TBEC: 20.0000 mg | DELAYED_RELEASE_TABLET | Freq: Every day | ORAL | 1 refills | Status: DC

## 2019-12-17 MED ORDER — SUCRALFATE 1 GM/10ML PO SUSP: 1.0000 g | Freq: Three times a day (TID) | ORAL | 0 refills | Status: DC

## 2019-12-17 MED ORDER — LIDOCAINE VISCOUS HCL 2 % MT SOLN
15.0000 mL | Freq: Once | OROMUCOSAL | Status: AC
  Administered 2019-12-17: 15 mL via ORAL
  Filled 2019-12-17: qty 15

## 2019-12-17 MED ORDER — ALUM & MAG HYDROXIDE-SIMETH 200-200-20 MG/5ML PO SUSP
30.0000 mL | Freq: Once | ORAL | Status: AC
  Administered 2019-12-17: 30 mL via ORAL
  Filled 2019-12-17: qty 30

## 2019-12-17 MED ORDER — FAMOTIDINE 20 MG PO TABS: 20.0000 mg | ORAL_TABLET | Freq: Two times a day (BID) | ORAL | 0 refills | Status: DC

## 2019-12-17 NOTE — Discharge Instructions (Addendum)
Acid Reflux Treatment  Diet: Start with a clear liquid diet, progressed to a full liquid diet, and then bland solids as you are able. Please adhere to the enclosed dietary suggestions.  In general, avoid NSAIDs (i.e. ibuprofen, naproxen, etc.), caffeine, alcohol, spicy foods, fatty foods, or any other foods that seem to cause your symptoms to arise.  Protonix: Take this medication daily, 20-30 minutes prior to your first meal, for the next 8 weeks.  Continue to take this medication even if you begin to feel better.  Pepcid: Take this medication twice a day for the next 5 days.  Sucralfate: Sucralfate (generic for Carafate) is meant to soothe symptoms of abdominal discomfort and reflux.  Follow-up: Please follow-up with your primary care provider or the gastroenterologist on this matter.  Return: Return to the ED for significantly worsening symptoms, persistent vomiting, persistent fever, vomiting blood, blood in the stools, dark stools, or any other major concerns.  For prescription assistance, may try using prescription discount sites or apps, such as goodrx.com

## 2019-12-17 NOTE — ED Provider Notes (Addendum)
[facility] EMERGENCY DEPARTMENT Provider Note   CSN: [id number] Arrival date & time: 12/17/19  2038     History Chief Complaint  Patient presents with  . Chest Pain  . Abdominal Pain    Jonathan Nash is a 29 y.o. male.  HPI      Jonathan Nash is a 29 y.o. male, with a history of GERD, presenting to the ED with epigastric pain beginning around 5 PM today. Pain is sharp, radiating toward the left upper quadrant of the abdomen, currently 7/10, worse with eating.  States he has had similar pain in the past with his GERD. Patient states he drinks about 2 beers every other day.  He smokes cigarettes as well.  Denies routine use of NSAIDs. Denies fever/chills, N/V/C/D, other chest pain, shortness of breath, cough, hematochezia/melena, or any other complaints.     Past Medical History:  Diagnosis Date  . GERD (gastroesophageal reflux disease)     Patient Active Problem List   Diagnosis Date Noted  . Chronic pain of left knee 01/07/2018    No past surgical history on file.     No family history on file.  Social History   Tobacco Use  . Smoking status: Current Every Day Smoker    Packs/day: 0.50    Types: Cigarettes  . Smokeless tobacco: Never Used  Vaping Use  . Vaping Use: Some days  Substance Use Topics  . Alcohol use: Yes    Comment: occasionally  . Drug use: No    Home Medications Prior to Admission medications   Medication Sig Start Date End Date Taking? Authorizing Provider  famotidine (PEPCID) 20 MG tablet Take 1 tablet (20 mg total) by mouth 2 (two) times daily for 5 days. 12/17/19 12/22/19  Aziya Arena C, PA-C  hydrOXYzine (ATARAX/VISTARIL) 25 MG tablet Take 1 tablet (25 mg total) by mouth every 6 (six) hours. 07/17/19   Horton, Mayer Masker, MD  pantoprazole (PROTONIX) 20 MG tablet Take 1 tablet (20 mg total) by mouth daily. 12/17/19 02/15/20  Nigel Wessman C, PA-C  sucralfate (CARAFATE) 1 GM/10ML suspension Take 10 mLs (1 g total) by  mouth 4 (four) times daily -  with meals and at bedtime. 12/17/19   Jaymz Traywick, Hillard Danker, PA-C    Allergies    Patient has no known allergies.  Review of Systems   Review of Systems  Constitutional: Negative for chills and fever.  Respiratory: Negative for cough and shortness of breath.   Cardiovascular: Negative for chest pain and leg swelling.  Gastrointestinal: Positive for abdominal pain. Negative for blood in stool, constipation, diarrhea, nausea and vomiting.  Genitourinary: Negative for dysuria, flank pain and hematuria.  Musculoskeletal: Negative for back pain.  All other systems reviewed and are negative.   Physical Exam Updated Vital Signs BP 121/87 (BP Location: Right Arm)   Pulse 78   Temp 97.9 F (36.6 C) (Oral)   Resp 18   Ht 6' (1.829 m)   Wt 74.8 kg   SpO2 97%   BMI 22.38 kg/m   Physical Exam Vitals and nursing note reviewed.  Constitutional:      General: He is not in acute distress.    Appearance: He is well-developed. He is not diaphoretic.  HENT:     Head: Normocephalic and atraumatic.     Mouth/Throat:     Mouth: Mucous membranes are moist.     Pharynx: Oropharynx is clear.  Eyes:     Conjunctiva/sclera: Conjunctivae normal.  Cardiovascular:  Rate and Rhythm: Normal rate and regular rhythm.     Pulses: Normal pulses.          Radial pulses are 2+ on the right side and 2+ on the left side.     Heart sounds: Normal heart sounds.  Pulmonary:     Effort: Pulmonary effort is normal. No respiratory distress.     Breath sounds: Normal breath sounds.  Abdominal:     Palpations: Abdomen is soft.     Tenderness: There is abdominal tenderness in the epigastric area. There is no guarding.  Musculoskeletal:     Cervical back: Neck supple.  Skin:    General: Skin is warm and dry.  Neurological:     Mental Status: He is alert.  Psychiatric:        Mood and Affect: Mood and affect normal.        Speech: Speech normal.        Behavior: Behavior normal.      ED Results / Procedures / Treatments   Labs (all labs ordered are listed, but only abnormal results are displayed) Labs Reviewed  COMPREHENSIVE METABOLIC PANEL - Abnormal; Notable for the following components:      Result Value   Glucose, Bld 107 (*)    Calcium 8.8 (*)    All other components within normal limits  CBC WITH DIFFERENTIAL/PLATELET - Abnormal; Notable for the following components:   MCH 34.1 (*)    All other components within normal limits  LIPASE, BLOOD    EKG EKG Interpretation  Date/Time:  Sunday December 17 2019 20:44:04 EST Ventricular Rate:  76 PR Interval:    QRS Duration: 99 QT Interval:  371 QTC Calculation: 418 R Axis:   -88 Text Interpretation: Sinus rhythm Right atrial enlargement Left anterior fascicular block Probable right ventricular hypertrophy ST elev, probable normal early repol pattern Baseline wander in lead(s) II III aVL aVF V6 No significant change since last tracing Confirmed by Richardean Canal [phone]) on 12/17/2019 10:11:34 PM   Radiology No results found.  Procedures Procedures (including critical care time)  Medications Ordered in ED Medications  alum & mag hydroxide-simeth (MAALOX/MYLANTA) 200-200-20 MG/5ML suspension 30 mL (30 mLs Oral Given 12/17/19 2109)    And  lidocaine (XYLOCAINE) 2 % viscous mouth solution 15 mL (15 mLs Oral Given 12/17/19 2109)    ED Course  I have reviewed the triage vital signs and the nursing notes.  Pertinent labs & imaging results that were available during my care of the patient were reviewed by me and considered in my medical decision making (see chart for details).  Clinical Course as of Dec 16 2213  Wynelle Link Dec 17, 2019  2205 Patient's symptoms improved following GI cocktail.   [SJ]    Clinical Course User Index [SJ] Omni Dunsworth C, PA-C   MDM Rules/Calculators/A&P                          Patient presents with epigastric pain feeling similar to previous GERD. Patient is nontoxic  appearing, afebrile, not tachycardic, not tachypneic, not hypotensive, maintains excellent SPO2 on room air, and is in no apparent distress.   I have reviewed the patient's chart to obtain more information.   I reviewed and interpreted the patient's labs. No leukocytosis.  Patient had significant improvement following GI cocktail.  Subsequent abdominal exam benign.  Low suspicion for surgical abdomen or other emergent pathology. Recommended gastroenterology follow-up. The patient was given  instructions for home care as well as return precautions. Patient voices understanding of these instructions, accepts the plan, and is comfortable with discharge.  Vitals:   12/17/19 2047 12/17/19 2048 12/17/19 2200  BP: 121/87  128/88  Pulse: 78  71  Resp: 18  18  Temp: 97.9 F (36.6 C)    TempSrc: Oral    SpO2: 97%  99%  Weight:  74.8 kg   Height:  6' (1.829 m)      Final Clinical Impression(s) / ED Diagnoses Final diagnoses:  Epigastric pain    Rx / DC Orders ED Discharge Orders         Ordered    pantoprazole (PROTONIX) 20 MG tablet  Daily        12/17/19 2209    famotidine (PEPCID) 20 MG tablet  2 times daily        12/17/19 2209    sucralfate (CARAFATE) 1 GM/10ML suspension  3 times daily with meals & bedtime        12/17/19 2209           Anselm Pancoast, PA-C 12/17/19 2215    Anselm Pancoast, PA-C 12/17/19 2215    Charlynne Pander, MD 12/17/19 2325

## 2019-12-17 NOTE — ED Triage Notes (Signed)
Patient reports having decreased appetite for a week or so with a weight loss of 9 pounds.  Started having upper abdominal pain and left sided chest pain this evening.  Describes both as sharp.  Also noted to be diaphoretic.  Denies any sob or nausea currently.

## 2020-01-10 ENCOUNTER — Encounter (HOSPITAL_COMMUNITY): Payer: Self-pay

## 2020-01-10 ENCOUNTER — Other Ambulatory Visit: Payer: Self-pay

## 2020-01-10 ENCOUNTER — Ambulatory Visit (HOSPITAL_COMMUNITY)
Admission: RE | Admit: 2020-01-10 | Discharge: 2020-01-10 | Disposition: A | Discharge status: Home / Self Care | Payer: Self-pay | Source: Ambulatory Visit | Attending: Family Medicine | Admitting: Family Medicine

## 2020-01-10 VITALS — BP 132/76 | HR 69 | Temp 99.5°F | Resp 18 | Ht 72.0 in | Wt 170.0 lb

## 2020-01-10 DIAGNOSIS — J029 Acute pharyngitis, unspecified: Secondary | ICD-10-CM

## 2020-01-10 DIAGNOSIS — J020 Streptococcal pharyngitis: Secondary | ICD-10-CM

## 2020-01-10 LAB — POCT RAPID STREP A, ED / UC: Streptococcus, Group A Screen (Direct): POSITIVE — AB

## 2020-01-10 MED ORDER — AMOXICILLIN 875 MG PO TABS: 875.0000 mg | ORAL_TABLET | Freq: Two times a day (BID) | ORAL | 0 refills | Status: AC

## 2020-01-10 NOTE — ED Triage Notes (Signed)
Pt presents today to see if he has strep throat.

## 2020-01-13 NOTE — ED Provider Notes (Signed)
  [facility]   [id number] 01/10/20 Arrival Time: 1850  ASSESSMENT & PLAN:  1. Strep throat     Begin: Meds ordered this encounter  Medications  . amoxicillin (AMOXIL) 875 MG tablet    Sig: Take 1 tablet (875 mg total) by mouth 2 (two) times daily for 10 days.    Dispense:  20 tablet    Refill:  0     Follow-up Information    Jeni Salles, FNP.   Specialty: Nurse Practitioner Why: If worsening or failing to improve as anticipated. Contact information: [address] [place] [address] [phone]               Reviewed expectations re: course of current medical issues. Questions answered. Outlined signs and symptoms indicating need for more acute intervention. Understanding verbalized. After Visit Summary given.   SUBJECTIVE: History from: patient. Jonathan Nash is a 29 y.o. male who reports ST. Few days. Subj fever. Decreased PO intake. No resp symptoms.    OBJECTIVE:  Vitals:   01/10/20 1908 01/10/20 1911  BP: 132/76   Pulse: 69   Resp: 18   Temp: 99.5 F (37.5 C)   TempSrc: Oral   SpO2: 100%   Weight:  77.1 kg  Height:  6' (1.829 m)    General appearance: alert; no distress Eyes: PERRLA; EOMI; conjunctiva normal HENT: [place]; AT; without nasal congestion; throat with significant erythema Neck: supple with bilateral LAD Lungs: speaks full sentences without difficulty; unlabored Extremities: no edema Skin: warm and dry Neurologic: normal gait Psychological: alert and cooperative; normal mood and affect  Labs: Results for orders placed or performed during the hospital encounter of 01/10/20  POCT Rapid Strep A  Result Value Ref Range   Streptococcus, Group A Screen (Direct) POSITIVE (A) NEGATIVE   Labs Reviewed  POCT RAPID STREP A, ED / UC - Abnormal; Notable for the following components:      Result Value   Streptococcus, Group A Screen (Direct) POSITIVE (*)    All other components within normal limits      No Known Allergies  Past Medical History:  Diagnosis Date  . GERD (gastroesophageal reflux disease)    Social History   Socioeconomic History  . Marital status: Single    Spouse name: Not on file  . Number of children: Not on file  . Years of education: Not on file  . Highest education level: Not on file  Occupational History  . Not on file  Tobacco Use  . Smoking status: Current Every Day Smoker    Packs/day: 0.50    Types: Cigarettes  . Smokeless tobacco: Never Used  Vaping Use  . Vaping Use: Some days  Substance and Sexual Activity  . Alcohol use: Yes    Comment: occasionally  . Drug use: No  . Sexual activity: Yes  Other Topics Concern  . Not on file  Social History Narrative  . Not on file   Social Determinants of Health   Financial Resource Strain: Not on file  Food Insecurity: Not on file  Transportation Needs: Not on file  Physical Activity: Not on file  Stress: Not on file  Social Connections: Not on file  Intimate Partner Violence: Not on file   History reviewed. No pertinent family history. History reviewed. No pertinent surgical history.   Mardella Layman, MD 01/13/20 [phone]

## 2022-01-24 ENCOUNTER — Emergency Department (HOSPITAL_BASED_OUTPATIENT_CLINIC_OR_DEPARTMENT_OTHER)
Admission: EM | Admit: 2022-01-24 | Discharge: 2022-01-24 | Disposition: A | Discharge status: Home / Self Care | Payer: Self-pay | Attending: Emergency Medicine | Admitting: Emergency Medicine

## 2022-01-24 ENCOUNTER — Encounter (HOSPITAL_BASED_OUTPATIENT_CLINIC_OR_DEPARTMENT_OTHER): Payer: Self-pay

## 2022-01-24 ENCOUNTER — Other Ambulatory Visit: Payer: Self-pay

## 2022-01-24 DIAGNOSIS — K0889 Other specified disorders of teeth and supporting structures: Secondary | ICD-10-CM | POA: Insufficient documentation

## 2022-01-24 MED ORDER — NAPROXEN 500 MG PO TABS: 500.0000 mg | ORAL_TABLET | Freq: Two times a day (BID) | ORAL | 0 refills | Status: AC

## 2022-01-24 MED ORDER — AMOXICILLIN 500 MG PO CAPS: 500.0000 mg | ORAL_CAPSULE | Freq: Three times a day (TID) | ORAL | 0 refills | Status: AC

## 2022-01-24 MED ORDER — NAPROXEN 250 MG PO TABS
500.0000 mg | ORAL_TABLET | Freq: Once | ORAL | Status: AC
  Administered 2022-01-24: 500 mg via ORAL
  Filled 2022-01-24: qty 2

## 2022-01-24 MED ORDER — AMOXICILLIN 500 MG PO CAPS
500.0000 mg | ORAL_CAPSULE | Freq: Once | ORAL | Status: AC
  Administered 2022-01-24: 500 mg via ORAL
  Filled 2022-01-24: qty 1

## 2022-01-24 NOTE — ED Triage Notes (Signed)
Pt with a few days worth of LT lower dental pain; usnure which tooth. Thinks it might be infected. Reports sensitivity with touching the area. No drainage noted. Pt with chills and sweats at home; reports he cannot make appointment soon enough--booked up. Taking tylenol and motrin with minor relief.

## 2022-01-24 NOTE — ED Provider Notes (Signed)
[facility] EMERGENCY DEPARTMENT  Provider Note  CSN: [id number] Arrival date & time: 01/24/22 6301  History Chief Complaint  Patient presents with   Dental Pain    Jonathan Nash is a 31 y.o. male reports toothache on L lower 2nd molar for the last several days. Tried to see a dentist but they were booked. Pain improved some with OTC meds. No fevers, no drainage or injuries. No difficulty breathing or swallowing.    Home Medications Prior to Admission medications   Medication Sig Start Date End Date Taking? Authorizing Provider  amoxicillin (AMOXIL) 500 MG capsule Take 1 capsule (500 mg total) by mouth 3 (three) times daily. 01/24/22  Yes Pollyann Savoy, MD  naproxen (NAPROSYN) 500 MG tablet Take 1 tablet (500 mg total) by mouth 2 (two) times daily. 01/24/22  Yes Pollyann Savoy, MD  famotidine (PEPCID) 20 MG tablet Take 1 tablet (20 mg total) by mouth 2 (two) times daily for 5 days. 12/17/19 01/10/20  Joy, Shawn C, PA-C  pantoprazole (PROTONIX) 20 MG tablet Take 1 tablet (20 mg total) by mouth daily. 12/17/19 01/10/20  Joy, Shawn C, PA-C  sucralfate (CARAFATE) 1 GM/10ML suspension Take 10 mLs (1 g total) by mouth 4 (four) times daily -  with meals and at bedtime. 12/17/19 01/10/20  Joy, Hillard Danker, PA-C     Allergies    Patient has no known allergies.   Review of Systems   Review of Systems Please see HPI for pertinent positives and negatives  Physical Exam BP (!) 142/128 (BP Location: Right Arm)   Pulse 79   Temp 99.6 F (37.6 C) (Oral)   Resp 18   Ht 6' (1.829 m)   Wt 79.4 kg   SpO2 98%   BMI 23.73 kg/m   Physical Exam Vitals and nursing note reviewed.  HENT:     Head: Normocephalic.     Nose: Nose normal.     Mouth/Throat:     Comments: Mild tenderness to palpation of the L lower 2nd molar, no fluctuance, or caries seen Eyes:     Extraocular Movements: Extraocular movements intact.  Pulmonary:     Effort: Pulmonary effort is normal.   Musculoskeletal:        General: Normal range of motion.     Cervical back: Neck supple.  Skin:    Findings: No rash (on exposed skin).  Neurological:     Mental Status: He is alert and oriented to person, place, and time.  Psychiatric:        Mood and Affect: Mood normal.     ED Results / Procedures / Treatments   EKG None  Procedures Procedures  Medications Ordered in the ED Medications  naproxen (NAPROSYN) tablet 500 mg (has no administration in time range)  amoxicillin (AMOXIL) capsule 500 mg (has no administration in time range)    Initial Impression and Plan  Patient here with dental pain, no signs of drainable abscess or deep tissue infection. Plan oral NSAIDs, Abx and outpatient dental follow up.   ED Course       MDM Rules/Calculators/A&P Medical Decision Making Problems Addressed: Toothache: acute illness or injury  Risk Prescription drug management.    Final Clinical Impression(s) / ED Diagnoses Final diagnoses:  Toothache    Rx / DC Orders ED Discharge Orders          Ordered    naproxen (NAPROSYN) 500 MG tablet  2 times daily  01/24/22 0559    amoxicillin (AMOXIL) 500 MG capsule  3 times daily        01/24/22 0559             Pollyann Savoy, MD 01/24/22 [phone]
